# Patient Record
Sex: Female | Born: 1935 | Race: White | Hispanic: No | Marital: Married | Smoking: Never smoker
Health system: Southern US, Community
[De-identification: ages and names within clinical notes are randomized; demographics above are authoritative.]

## PROBLEM LIST (undated history)

## (undated) DIAGNOSIS — M5126 Other intervertebral disc displacement, lumbar region: Secondary | ICD-10-CM

## (undated) DIAGNOSIS — M549 Dorsalgia, unspecified: Secondary | ICD-10-CM

## (undated) DIAGNOSIS — E7800 Pure hypercholesterolemia, unspecified: Secondary | ICD-10-CM

## (undated) DIAGNOSIS — G8929 Other chronic pain: Secondary | ICD-10-CM

## (undated) DIAGNOSIS — F32A Depression, unspecified: Secondary | ICD-10-CM

## (undated) DIAGNOSIS — I10 Essential (primary) hypertension: Secondary | ICD-10-CM

## (undated) DIAGNOSIS — E039 Hypothyroidism, unspecified: Secondary | ICD-10-CM

## (undated) DIAGNOSIS — F419 Anxiety disorder, unspecified: Secondary | ICD-10-CM

## (undated) DIAGNOSIS — G259 Extrapyramidal and movement disorder, unspecified: Secondary | ICD-10-CM

## (undated) DIAGNOSIS — F329 Major depressive disorder, single episode, unspecified: Secondary | ICD-10-CM

## (undated) DIAGNOSIS — K219 Gastro-esophageal reflux disease without esophagitis: Secondary | ICD-10-CM

## (undated) DIAGNOSIS — M1990 Unspecified osteoarthritis, unspecified site: Secondary | ICD-10-CM

## (undated) HISTORY — DX: Extrapyramidal and movement disorder, unspecified: G25.9

## (undated) HISTORY — PX: TONSILLECTOMY: SUR1361

## (undated) HISTORY — PX: OTHER SURGICAL HISTORY: SHX169

## (undated) HISTORY — PX: CHOLECYSTECTOMY: SHX55

## (undated) HISTORY — PX: TOTAL KNEE ARTHROPLASTY: SHX125

---

## 1999-03-24 ENCOUNTER — Other Ambulatory Visit: Admission: RE | Admit: 1999-03-24 | Discharge: 1999-03-24 | Payer: Self-pay | Admitting: Gynecology

## 2000-05-26 ENCOUNTER — Other Ambulatory Visit: Admission: RE | Admit: 2000-05-26 | Discharge: 2000-05-26 | Payer: Self-pay | Admitting: Gynecology

## 2001-06-07 ENCOUNTER — Other Ambulatory Visit: Admission: RE | Admit: 2001-06-07 | Discharge: 2001-06-07 | Payer: Self-pay | Admitting: Gynecology

## 2004-02-20 ENCOUNTER — Encounter: Admission: RE | Admit: 2004-02-20 | Discharge: 2004-02-20 | Payer: Self-pay | Admitting: Internal Medicine

## 2007-11-17 ENCOUNTER — Inpatient Hospital Stay (HOSPITAL_COMMUNITY): Admission: RE | Admit: 2007-11-17 | Discharge: 2007-11-21 | Payer: Self-pay | Admitting: Orthopedic Surgery

## 2008-11-25 IMAGING — CT CT ABD-PELV W/O CM
2 of 4 series · 14 of 42 positions shown, 19 images · non-contrast
Comparison: NONE

CLINICAL DATA: Gas, bloating, and diarrhea. 

CT OF THE ABDOMEN AND PELVIS FOLLOWING ORAL CONTRAST
TECHNIQUE: Axial scans were obtained from the hemidiaphragms 
through the symphysis pubis after oral ingestion of contrast 
material.  Intravenous access was unable to be obtained.

[Series 2: wo · axial · 0.64mm/px · z∈[+389,+724]mm · 11 of 77 slices shown, 16 images]
[im 5/77  soft-tissue]
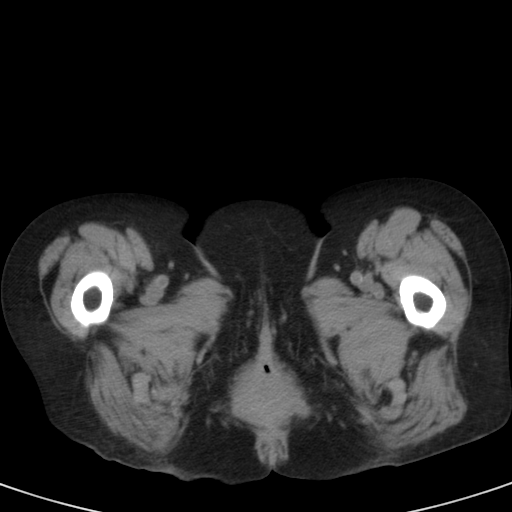
[im 5/77  bone]
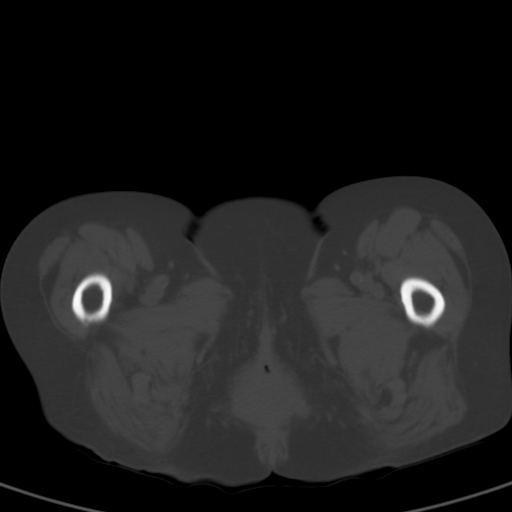
[im 14/77  soft-tissue]
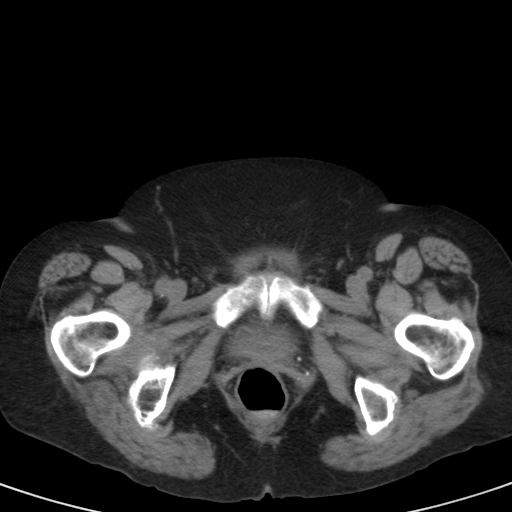
[im 23/77  soft-tissue]
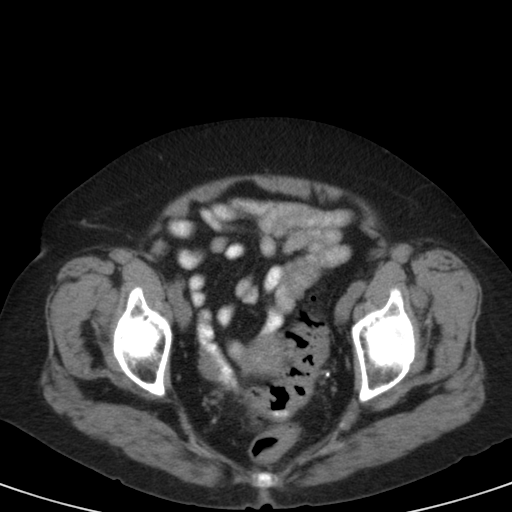
[im 27/77  soft-tissue]
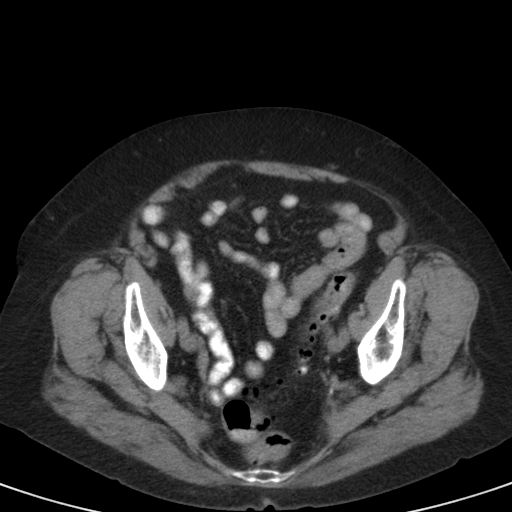
[im 36/77  soft-tissue]
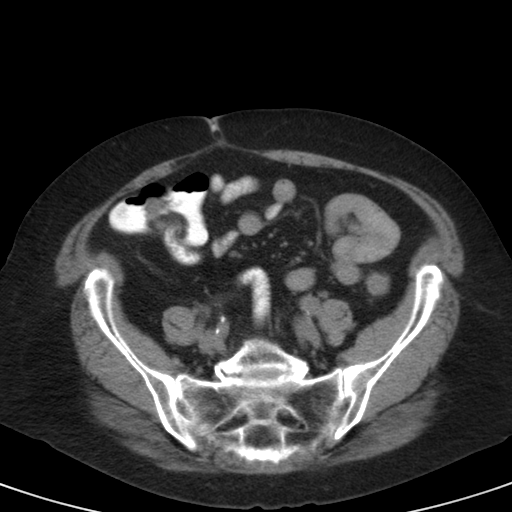
[im 41/77  soft-tissue]
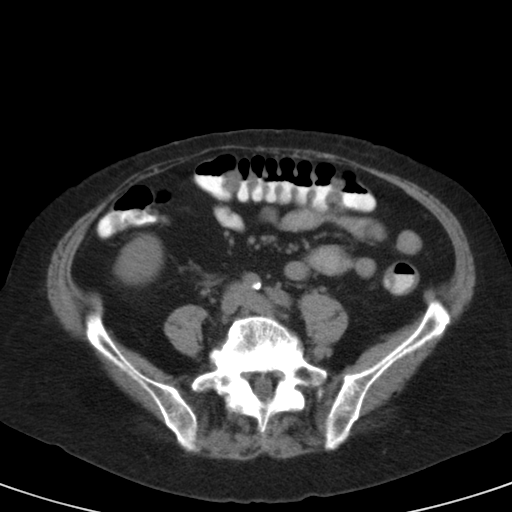
[im 50/77  soft-tissue]
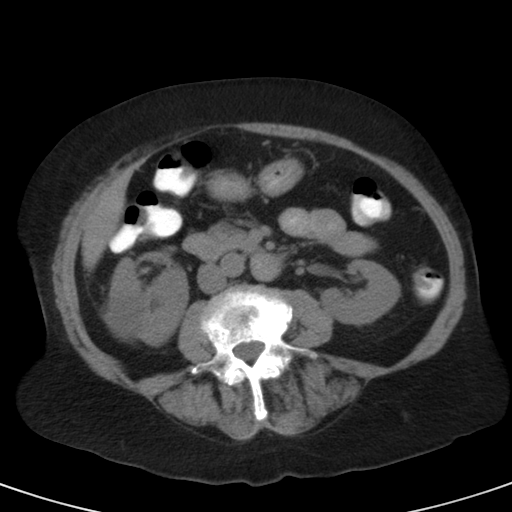
[im 59/77  soft-tissue]
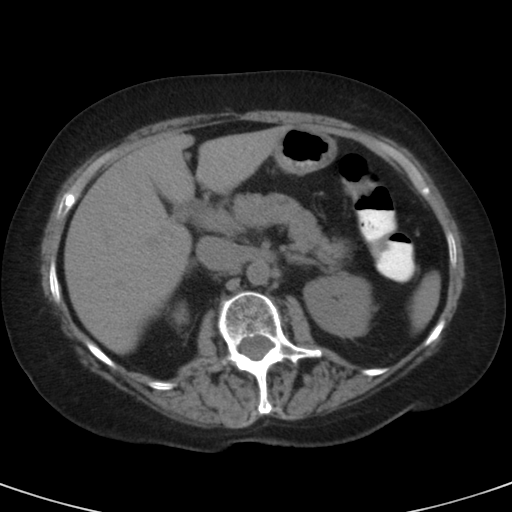
[im 59/77  lung]
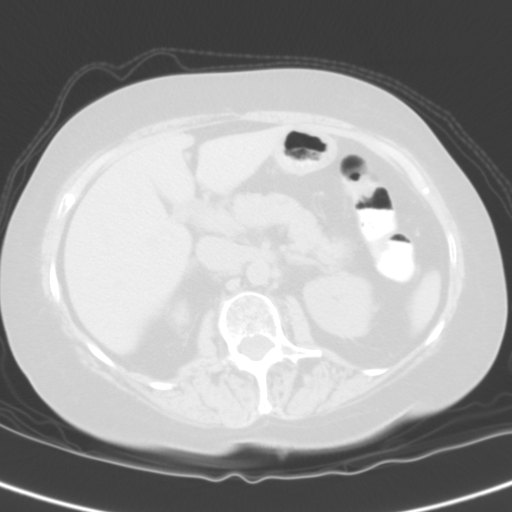
[im 63/77  soft-tissue]
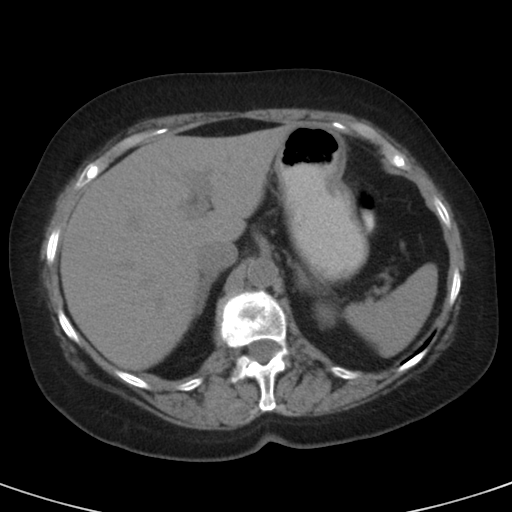
[im 63/77  lung]
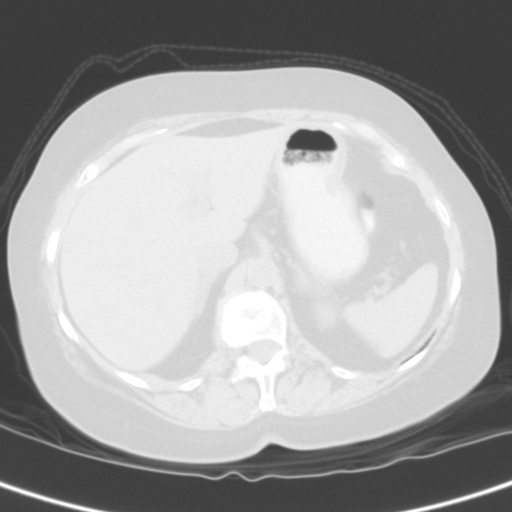
[im 63/77  bone]
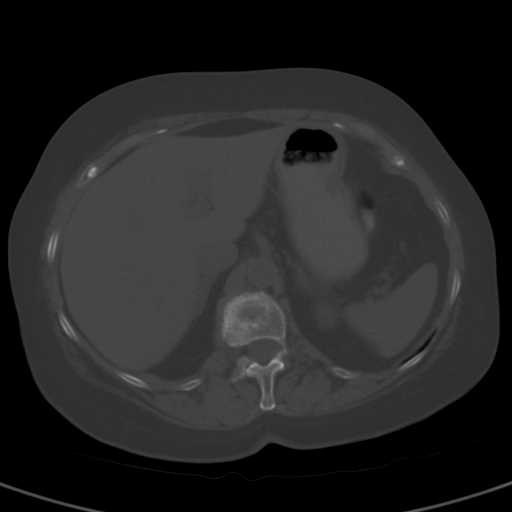
[im 68/77  lung]
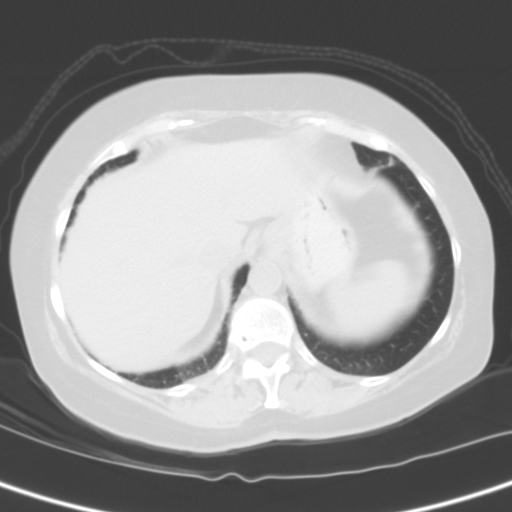
[im 72/77  soft-tissue]
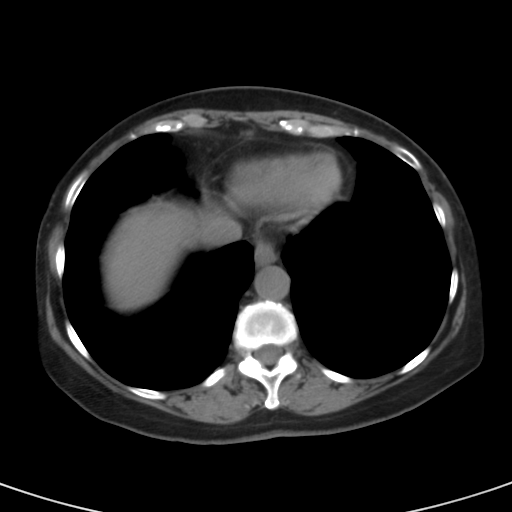
[im 72/77  lung]
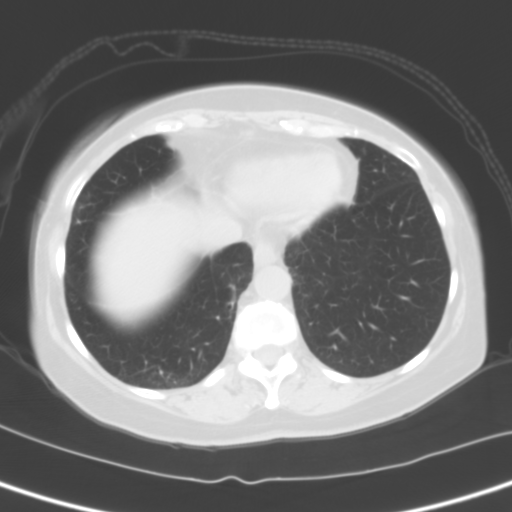

[coronals · coronal · 0.74mm/px · 3 of 63 slices shown]
[im 21/63  soft-tissue]
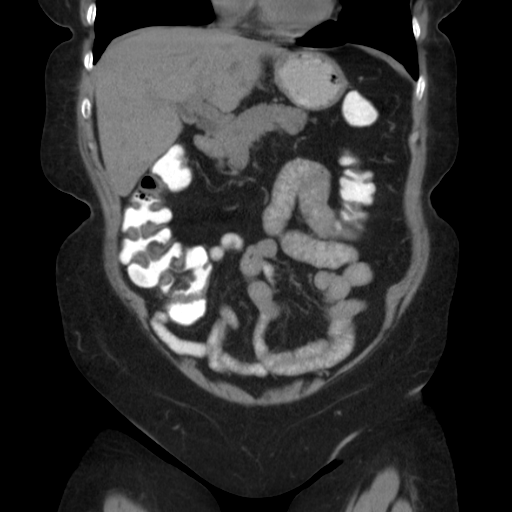
[im 28/63  soft-tissue]
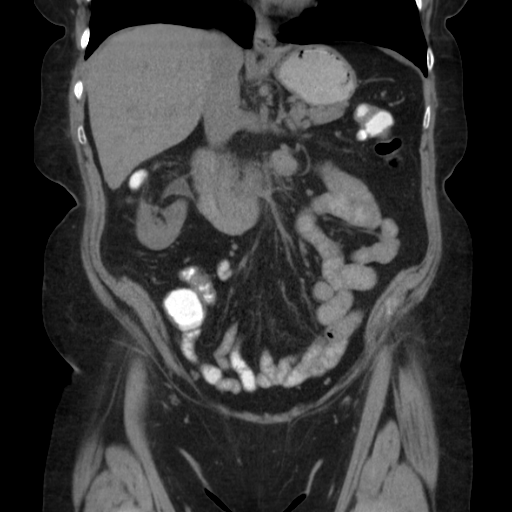
[im 35/63  soft-tissue]
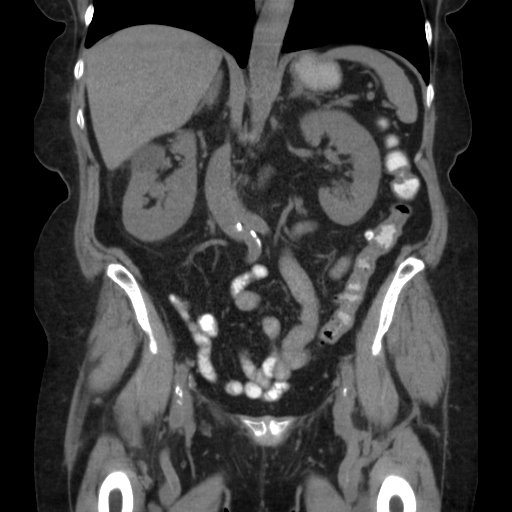

[14 of 42 positions shown; findings below may reference images not displayed]

FINDINGS: The lung bases are clear.  The liver, spleen, pancreas, 
adrenal glands, and left kidney have a normal appearance.  There 
is a 5.5 cm low-density mass in the right kidney most consistent 
with a cyst.  Atherosclerotic calcification is present.  
Spondylosis is noted in the lumbar spine.  No lytic or blastic 
lesions are identified. There is uncomplicated diverticulosis.  I 
see no evidence for diverticulitis.  No inflammatory disease is 
seen in either lower quadrant.  No free fluid.
IMPRESSION: Simple cyst right kidney.  Uncomplicated 
diverticulosis.  No inflammatory disease seen. Eilla Som

## 2010-09-08 NOTE — Op Note (Signed)
NAME:  Kelly Erickson, Kelly Erickson                 ACCOUNT NO.:  [id number]   MEDICAL RECORD NO.:  [id number]          PATIENT TYPE:  INP   LOCATION:  5022                         FACILITY:  [facility]   PHYSICIAN:  Harvie Junior, M.D.   DATE OF BIRTH:  1931/03/03   DATE OF PROCEDURE:  11/17/2007  DATE OF DISCHARGE:                               OPERATIVE REPORT   PREOPERATIVE DIAGNOSIS:  End-stage degenerative joint disease, right.   POSTOPERATIVE DIAGNOSIS:  End-stage degenerative joint disease, right.   PROCEDURE:  1. Right total knee replacement with a  Sigma system size 2.5 femur,      size 2 tibia, 10-mm bridging bearing, and a 32-mm All-Polyethylene      patella.  2. Computer-assisted right total knee replacement.   SURGEON:  Harvie Junior, MD   ASSISTANT:  [name], PA.   ANESTHESIA:  General.   BRIEF HISTORY:  Ms. Kelly Erickson is a 77 year old female with long history of  having had significant degenerative joint disease of the right knee.  She has had previous arthroscopic examination, which showed grade IV  changes over a significant portion of the medial femoral condyle and  medial tibial plateau.  We talked about treatment options and also we  felt that the total knee replacement is going to be the most appropriate  course of action.  She was brought to the operating room for this  procedure.  Because of the longevity in her family and relative youthful  appearance, it was felt that she potentially would need a significant  longevity __________ a  computer-assisted alignment was chosen to be  used.   OPERATIVE PROCEDURE:  The patient was brought to the operating room and  after adequate anesthesia was obtained with general anesthetic, the  patient was placed supine on the operating table.  The right leg was  then prepped and draped in usual sterile fashion.  Following this, a  midline incision was made.  Subcutaneous tissues was taken down to level  of the extensor mechanism,  medial parapatellar arthrotomy was  undertaken, and once this was completed, the medial and lateral  meniscectomy was performed as well as anterior and posterior cruciate  excision, and eventual patellar fat-pad excision.  Once this was  completed, retractors were put in place and then the computer-assisted  modules were attached, 2 pins in the tibia, 2 pins in the femur, and  then the arrays.  Once this was completed and marked down, then we did a  registration process.  This lasted for about 20 minutes of the surgical  procedure.  Once this was completed, the tibia has been cut  perpendicular to the long axis under computer-assisted guidance.  Once  this was completed, the femur is cut perpendicular to the anatomic axis.  Once that was completed, spacer bar was put in place to check the  overall gaps that showed to have certainly adequate gaps at this point.  Attention then turned to trialing the femur, which was sized with my  computer and by a regular guide.  The femur size was 2.5 and this  was  chosen, anterior and posterior cuts were made as well as chamfers.  Then, attention was turned to the tibia, which was sized to a 2.  We  then reamed distally for this and then put a size 2 trial in with a  hammer in the keel at this point.  Trial reduction was then undertaken  as part of the 10-mm bridging bearing easily with full extension.  No  medial or lateral instability, excellent stability, excellent gap  balance, and perfect neutral alignment.  Once this was completed,  attention was turned towards the 20 mm of patella length and cut down to  the level of 13, just to allow access for the component, and 32 really  was generous.  We were able to get a cummerbund down completely, but it  was a generous situation.  At this point, the 32 was chosen, the lugs  were drilled for it and the trialed patella was put in place.  The femur  then had lugs drilled.  Once this was completed, all trial  components  were removed.  The knee was copiously and thoroughly irrigated with  pulsatile lavage irrigation and then dried.  At this point, cement was  mixed on the back table, 2 bags of cement with 2 g of Kefzol and this  was mixed; and once this was completed, the components were cemented in  place with size 2 tibia and size 2.5 femur and a trial 10-mm bearing was  put in place and a 32-mm All-Polyethylene patella.  Once the cement was  allowed to harden, the tourniquet was let down, the trial poly was  removed, and all excess cement had been removed previously, and a final  check was made for loosened cement, none was found.  At this point, the  bleeding was controlled with electrocautery and attention was turned  towards the placement of the __________, which was then placed and once  it was placed, range of motion was then again checked, to ease the full  extension, flexion was followed, and at this point, the medial  parapatellar arthrotomy was closed with 1-Vicryl running after a medium  Hemovac drain was left in place.  The skin was closed with 0 and 2-0  Vicryl, and skin with staples.  A sterile compression dressing was  applied as well as a knee immobilizer and the patient was taken to the  recovery room and she was noted to be in satisfactory condition.   ESTIMATED BLOOD LOSS:  Less than 100 mL.      Harvie Junior, M.D.  Electronically Signed     JLG/MEDQ  D:  11/17/2007  T:  11/18/2007  Job:  [id number]

## 2010-09-11 NOTE — Discharge Summary (Signed)
NAME:  Kelly Erickson, Kelly Erickson                 ACCOUNT NO.:  [id number]   MEDICAL RECORD NO.:  [id number]          PATIENT TYPE:  INP   LOCATION:  6729                         FACILITY:  [facility]   PHYSICIAN:  Harvie Junior, M.D.   DATE OF BIRTH:  1931-03-09   DATE OF ADMISSION:  11/17/2007  DATE OF DISCHARGE:  11/21/2007                               DISCHARGE SUMMARY   ADMITTING DIAGNOSES:  1. End-stage degenerative joint disease, right knee.  2. Hypertension.  3. Hyperlipidemia.   DISCHARGE DIAGNOSES:  1. End-stage degenerative joint disease, right knee.  2. Hypertension.  3. Hyperlipidemia.  4. Urinary tract infection.  5. Acute blood loss anemia.   PROCEDURES IN HOSPITAL:  Right total knee arthroplasty, computer-  assisted, Jodi Geralds MD.   CONSULTATIONS:  None.   BRIEF HISTORY:  Kelly Erickson is a pleasant 77 year old patient, is well known  to [place].  She has a long history of right knee pain.  She has swelling and  grinding when she bends her knee.  Standing x-rays of the right knee  shows that she has bone-on-bone degenerative arthritis with complete  loss of articular cartilage.  She has night pain and pain with every  step and has gotten no relief with exhaustive conservative treatment  including modification of activity, use of medications, and cortisone  injections.  Based upon her clinical radiographic findings, she is felt  to be a candidate for a right total knee arthroplasties and is admitted  for this.   PERTINENT LABORATORY STUDIES:  The patient's EKG on admission showed  normal sinus rhythm with nonspecific T-wave abnormality.  Hemoglobin on  admission was 12.6, hematocrit 37.6, indices within normal limits.  On  postop day #1, hemoglobin was 9.0; postop day #2, it was 8.7; postop day  #3, it was 8.8.  Protime on admission was 12.5 seconds with INR of 0.9,  PTT was 26 on day of discharge.  Her INR was 1.7.  BMET on postop day #1  showed no abnormalities other than slightly  elevated BUN at 33.  On  postop day #2, her BUN and creatinine were in the normal range.  Urinalysis on admission showed 7-10 WBCs per high-powered field.  Urine  culture on November 20, 2007, showed greater than 1000 colonies of  enterococcus species.   HOSPITAL COURSE:  The patient underwent right total knee arthroplasty,  computer-assisted as well as described in the Dr. Luiz Blare' operative note  on November 17, 2007.  Preoperatively, she was given 80 mg IV of gentamicin  and 1 g of Ancef.  She underwent a right total knee replacement as well  as described in the Dr. Luiz Blare' operative note.  She was put on the PCA  and morphine pump for pain control.  Physical therapy is ordered for  ambulation weightbearing as tolerated on the right side.  CPM machine  was used for range of motion.  On postop day #1, her pain was well-  controlled.  No shortness of breath.  Her hemoglobin was 9.0.  She was  afebrile.  Her vital signs were stable.  Her  right knee dressing was  clean and dry and knee was intact distally.  She had a little confusion  and the Percocet was discontinued and she was started on Vicodin for  pain and minimize the use of the morphine.  On postop day #2, she was  comfortable and she is alert and oriented.  She is using Tylenol for  pain at this point.  Her hemoglobin is 8.7.  She is continued on oral  iron, which she had started at the time of surgery.  Her dressing was  changed on her Foley catheter was removed and her drain was  discontinued.  Her IV was converted to a saline lock.  On postop day #3,  she had some complaints of frequent urination.  Her temperature was  99.2.  A urinalysis was obtained and she was started on antibiotics for  urinary tract infection and urine cultures obtained.  She made good  progress with her therapy and on November 21, 2007, she was in a improved  condition.  Her INR was 1.7.  Hemoglobin was stable.  She was discharged  home after being seen by physical  therapy.  She is at home CPM machine  for right knee.  She will be on weightbearing as tolerated on right.  She will be on a regular diet.  She will continue on her usual home  medications with the addition of Cipro 500 mg b.i.d. x5 days for urinary  tract infection, Percocet 5 mg one to two q.4-6 h. p.r.n. pain, and  Coumadin 5 mg one daily unless directed to take otherwise.  She will get  home health physical therapy and home health nurse for protimes and  Coumadin management x1 month postop for DVT prophylaxis.  She will  follow up Dr. Stacy Gardner office in 2 weeks.  She will keep her wound clean  and dry.  Change her dressing every third day.  Call if any problems.      [name], P.A.      Harvie Junior, M.D.  Electronically Signed    JB/MEDQ  D:  01/03/2008  T:  01/04/2008  Job:  [id number]   cc:   Kendrick [place], M.D.

## 2011-01-22 LAB — URINE CULTURE
Colony Count: >=100000
Special Requests: NEGATIVE

## 2011-01-22 LAB — URINALYSIS, ROUTINE W REFLEX MICROSCOPIC
Bilirubin Urine: NEGATIVE
Bilirubin Urine: NEGATIVE
Glucose, UA: NEGATIVE
Glucose, UA: NEGATIVE
Hgb urine dipstick: NEGATIVE
Hgb urine dipstick: NEGATIVE
Ketones, ur: NEGATIVE
Ketones, ur: NEGATIVE
Nitrite: NEGATIVE
Nitrite: NEGATIVE
Protein, ur: NEGATIVE
Protein, ur: NEGATIVE
Specific Gravity, Urine: 1.007
Specific Gravity, Urine: 1.026
Urobilinogen, UA: 0.2
Urobilinogen, UA: 0.2
pH: 6
pH: 6.5

## 2011-01-22 LAB — CBC
HCT: 25.2 — ABNORMAL LOW
HCT: 25.9 — ABNORMAL LOW
HCT: 26.7 — ABNORMAL LOW
HCT: 37.6
Hemoglobin: 12.6
Hemoglobin: 8.7 — ABNORMAL LOW
Hemoglobin: 8.8 — ABNORMAL LOW
Hemoglobin: 9 — ABNORMAL LOW
MCHC: 33.4
MCHC: 33.8
MCHC: 33.9
MCHC: 34.4
MCV: 88.4
MCV: 89.6
MCV: 90.2
MCV: 90.4
Platelets: 304
Platelets: 312
Platelets: 337
Platelets: 439 — ABNORMAL HIGH
RBC: 2.85 — ABNORMAL LOW
RBC: 2.89 — ABNORMAL LOW
RBC: 2.96 — ABNORMAL LOW
RBC: 4.16
RDW: 11.9
RDW: 12.1
RDW: 12.1
RDW: 12.1
WBC: 10.2
WBC: 10.4
WBC: 11.9 — ABNORMAL HIGH
WBC: 12.1 — ABNORMAL HIGH

## 2011-01-22 LAB — DIFFERENTIAL
Basophils Absolute: 0.1
Basophils Relative: 1
Eosinophils Absolute: 0.2
Eosinophils Relative: 2
Lymphocytes Relative: 19
Lymphs Abs: 2
Monocytes Absolute: 0.9
Monocytes Relative: 9
Neutro Abs: 7
Neutrophils Relative %: 69

## 2011-01-22 LAB — COMPREHENSIVE METABOLIC PANEL
ALT: 25
AST: 29
Albumin: 4
Alkaline Phosphatase: 90
BUN: 33 — ABNORMAL HIGH
CO2: 28
Calcium: 10.8 — ABNORMAL HIGH
Chloride: 104
Creatinine, Ser: 1.08
GFR calc Af Amer: 60 — ABNORMAL LOW
GFR calc non Af Amer: 49 — ABNORMAL LOW
Glucose, Bld: 105 — ABNORMAL HIGH
Potassium: 4.4
Sodium: 142
Total Bilirubin: 0.8
Total Protein: 6.9

## 2011-01-22 LAB — PROTIME-INR
INR: 0.9
INR: 0.9
INR: 1.1
INR: 1.3
INR: 1.7 — ABNORMAL HIGH
Prothrombin Time: 12.5
Prothrombin Time: 12.5
Prothrombin Time: 14.3
Prothrombin Time: 16.4 — ABNORMAL HIGH
Prothrombin Time: 20.3 — ABNORMAL HIGH

## 2011-01-22 LAB — BASIC METABOLIC PANEL
BUN: 16
CO2: 25
Calcium: 8.7
Chloride: 95 — ABNORMAL LOW
Creatinine, Ser: 0.9
GFR calc Af Amer: >60
GFR calc non Af Amer: >60
Glucose, Bld: 154 — ABNORMAL HIGH
Potassium: 3.9
Sodium: 131 — ABNORMAL LOW

## 2011-01-22 LAB — URINE MICROSCOPIC-ADD ON

## 2011-01-22 LAB — TYPE AND SCREEN
ABO/RH(D): O POS
Antibody Screen: NEGATIVE

## 2011-01-22 LAB — APTT: aPTT: 26

## 2011-01-22 LAB — ABO/RH: ABO/RH(D): O POS

## 2011-07-11 ENCOUNTER — Emergency Department (HOSPITAL_BASED_OUTPATIENT_CLINIC_OR_DEPARTMENT_OTHER)
Admission: EM | Admit: 2011-07-11 | Discharge: 2011-07-11 | Disposition: A | Discharge status: Home / Self Care | Payer: Medicare Other | Attending: Emergency Medicine | Admitting: Emergency Medicine

## 2011-07-11 ENCOUNTER — Encounter (HOSPITAL_BASED_OUTPATIENT_CLINIC_OR_DEPARTMENT_OTHER): Payer: Self-pay | Admitting: Emergency Medicine

## 2011-07-11 DIAGNOSIS — F411 Generalized anxiety disorder: Secondary | ICD-10-CM | POA: Insufficient documentation

## 2011-07-11 DIAGNOSIS — M129 Arthropathy, unspecified: Secondary | ICD-10-CM | POA: Insufficient documentation

## 2011-07-11 DIAGNOSIS — M545 Low back pain, unspecified: Secondary | ICD-10-CM | POA: Insufficient documentation

## 2011-07-11 DIAGNOSIS — F329 Major depressive disorder, single episode, unspecified: Secondary | ICD-10-CM | POA: Insufficient documentation

## 2011-07-11 DIAGNOSIS — K219 Gastro-esophageal reflux disease without esophagitis: Secondary | ICD-10-CM | POA: Insufficient documentation

## 2011-07-11 DIAGNOSIS — E039 Hypothyroidism, unspecified: Secondary | ICD-10-CM | POA: Insufficient documentation

## 2011-07-11 DIAGNOSIS — I10 Essential (primary) hypertension: Secondary | ICD-10-CM | POA: Insufficient documentation

## 2011-07-11 DIAGNOSIS — F3289 Other specified depressive episodes: Secondary | ICD-10-CM | POA: Insufficient documentation

## 2011-07-11 DIAGNOSIS — E7800 Pure hypercholesterolemia, unspecified: Secondary | ICD-10-CM | POA: Insufficient documentation

## 2011-07-11 DIAGNOSIS — M5126 Other intervertebral disc displacement, lumbar region: Secondary | ICD-10-CM | POA: Insufficient documentation

## 2011-07-11 HISTORY — DX: Hypothyroidism, unspecified: E03.9

## 2011-07-11 HISTORY — DX: Other intervertebral disc displacement, lumbar region: M51.26

## 2011-07-11 HISTORY — DX: Pure hypercholesterolemia, unspecified: E78.00

## 2011-07-11 HISTORY — DX: Gastro-esophageal reflux disease without esophagitis: K21.9

## 2011-07-11 HISTORY — DX: Other chronic pain: G89.29

## 2011-07-11 HISTORY — DX: Major depressive disorder, single episode, unspecified: F32.9

## 2011-07-11 HISTORY — DX: Anxiety disorder, unspecified: F41.9

## 2011-07-11 HISTORY — DX: Depression, unspecified: F32.A

## 2011-07-11 HISTORY — DX: Dorsalgia, unspecified: M54.9

## 2011-07-11 HISTORY — DX: Unspecified osteoarthritis, unspecified site: M19.90

## 2011-07-11 HISTORY — DX: Essential (primary) hypertension: I10

## 2011-07-11 MED ORDER — CYCLOBENZAPRINE HCL 10 MG PO TABS: 10.0000 mg | ORAL_TABLET | Freq: Three times a day (TID) | ORAL | Status: AC | PRN

## 2011-07-11 MED ORDER — PREDNISONE 20 MG PO TABS: 40.0000 mg | ORAL_TABLET | Freq: Every day | ORAL | Status: AC

## 2011-07-11 NOTE — Discharge Instructions (Signed)
Back Pain, Adult Low back pain is very common. About 1 in 5 people have back pain.The cause of low back pain is rarely dangerous. The pain often gets better over time.About half of people with a sudden onset of back pain feel better in just 2 weeks. About 8 in 10 people feel better by 6 weeks.  CAUSES Some common causes of back pain include:  Strain of the muscles or ligaments supporting the spine.   Wear and tear (degeneration) of the spinal discs.   Arthritis.   Direct injury to the back.  DIAGNOSIS Most of the time, the direct cause of low back pain is not known.However, back pain can be treated effectively even when the exact cause of the pain is unknown.Answering your caregiver's questions about your overall health and symptoms is one of the most accurate ways to make sure the cause of your pain is not dangerous. If your caregiver needs more information, he or she may order lab work or imaging tests (X-rays or MRIs).However, even if imaging tests show changes in your back, this usually does not require surgery. HOME CARE INSTRUCTIONS For many people, back pain returns.Since low back pain is rarely dangerous, it is often a condition that people can learn to manageon their own.   Remain active. It is stressful on the back to sit or stand in one place. Do not sit, drive, or stand in one place for more than 30 minutes at a time. Take short walks on level surfaces as soon as pain allows.Try to increase the length of time you walk each day.   Do not stay in bed.Resting more than 1 or 2 days can delay your recovery.   Do not avoid exercise or work.Your body is made to move.It is not dangerous to be active, even though your back may hurt.Your back will likely heal faster if you return to being active before your pain is gone.   Pay attention to your body when you bend and lift. Many people have less discomfortwhen lifting if they bend their knees, keep the load close to their  bodies,and avoid twisting. Often, the most comfortable positions are those that put less stress on your recovering back.   Find a comfortable position to sleep. Use a firm mattress and lie on your side with your knees slightly bent. If you lie on your back, put a pillow under your knees.   Only take over-the-counter or prescription medicines as directed by your caregiver. Over-the-counter medicines to reduce pain and inflammation are often the most helpful.Your caregiver may prescribe muscle relaxant drugs.These medicines help dull your pain so you can more quickly return to your normal activities and healthy exercise.   Put ice on the injured area.   Put ice in a plastic bag.   Place a towel between your skin and the bag.   Leave the ice on for 15 to 20 minutes, 3 to 4 times a day for the first 2 to 3 days. After that, ice and heat may be alternated to reduce pain and spasms.   Ask your caregiver about trying back exercises and gentle massage. This may be of some benefit.   Avoid feeling anxious or stressed.Stress increases muscle tension and can worsen back pain.It is important to recognize when you are anxious or stressed and learn ways to manage it.Exercise is a great option.  SEEK MEDICAL CARE IF:  You have pain that is not relieved with rest or medicine.   You have   pain that does not improve in 1 week.   You have new symptoms.   You are generally not feeling well.  SEEK IMMEDIATE MEDICAL CARE IF:   You have pain that radiates from your back into your legs.   You develop new bowel or bladder control problems.   You have unusual weakness or numbness in your arms or legs.   You develop nausea or vomiting.   You develop abdominal pain.   You feel faint.  Document Released: 04/12/2005 Document Revised: 04/01/2011 Document Reviewed: 08/31/2010 Exit[employer] Patient Information 2012 [employer].     RESOURCE GUIDE  Dental Problems  Patients with  Medicaid: [facility]                     [facility] [address]                                           [address] Phone:  [phone]                                                  Phone:  [phone]  If unable to pay or uninsured, contact:  [facility] or [facility] to become qualified for the adult dental clinic.  Chronic Pain Problems Contact [facility]  [phone] Patients need to be referred by their primary care doctor.  Insufficient Money for Medicine Contact [employer]:  call "211" or [facility] 271-5999.  No Primary Care Doctor Call [facility]  832-8000 Other agencies that provide inexpensive medical care    [facility]  832-8035    [facility]  832-7272    [facility]  [phone]    [facility] Clinic  832-4777    [facility]  [phone]    [facility]  [phone]  Psychological Services [facility]  [phone] [facility]  [phone] [facility]   800 853-5163 (emergency services 641-4993)  Substance Abuse Resources [facility]  336-882-2125 [facility] 336-784-9470 [facility] 336-285-9073 [facility] 336-845-3988 Residential & Outpatient Substance Abuse Program  800-659-3381  Abuse/Neglect [place] Child Abuse Hotline (336) 641-3795 [place] Child Abuse Hotline 800-378-5315 (After Hours)  Emergency Shelter [facility] (336) 271-5985  [facility] (336) 275-9566 [facility] (704) 372-4663  MRSA Hotline #:   832-7006    [place] Resources  [facility] [place]     [employer]                          [facility] [address] [place]                       [address]      [address]  [place]                                                   [place]                            [place] Phone:  [phone]                                   Phone:  [phone]                 Phone:  [phone]  [facility] Phone:  [phone]  [place] Child Abuse Hotline (336) 342-1394 (336) 342-3537 (After Hours)   

## 2011-07-11 NOTE — ED Provider Notes (Signed)
History    Kelly Erickson with lower back pain. Gradual onset 2 weeks ago. Radiates to R leg. Denies trauma. Pain achy at rest and much worse with movement. No fever or chills. No numbness, tingling or loss of strength. No urinary/bowel incontinence or retention. denis hx of spinal surgery. Denies use of blood thinning medication. No urinary complaints.  CSN: [id number]  Arrival date & time 07/11/11  1036   First MD Initiated Contact with Patient 07/11/11 1054      Chief Complaint  Patient presents with  . Leg Pain  . Back Pain    (Consider location/radiation/quality/duration/timing/severity/associated sxs/prior treatment) HPI  Past Medical History  Diagnosis Date  . Chronic back pain   . Hypertension   . GERD (gastroesophageal reflux disease)   . Hypercholesteremia   . Arthritis   . Lumbar herniated disc   . Hypothyroid   . Depression   . Anxiety     No past surgical history on file.  No family history on file.  History  Substance Use Topics  . Smoking status: Not on file  . Smokeless tobacco: Not on file  . Alcohol Use:     OB History    Grav Para Term Preterm Abortions TAB SAB Ect Mult Living                  Review of Systems   Review of symptoms negative unless otherwise noted in HPI.   Allergies  Review of patient's allergies indicates not on file.  Home Medications   Current Outpatient Rx  Name Route Sig Dispense Refill  . AMLODIPINE BESYLATE 10 MG PO TABS Oral Take 10 mg by mouth daily.    Marland Kitchen HYDROCODONE-ACETAMINOPHEN 5-325 MG PO TABS Oral Take 1 tablet by mouth 2 (two) times daily as needed.    Marland Kitchen LEVOTHYROXINE SODIUM 25 MCG PO TABS Oral Take 25 mcg by mouth daily.    Marland Kitchen METHOCARBAMOL 500 MG PO TABS Oral Take 500 mg by mouth 3 (three) times daily as needed.    Marland Kitchen METOPROLOL SUCCINATE ER 50 MG PO TB24 Oral Take 50 mg by mouth daily. Take with or immediately following a meal.      BP 134/72  Pulse 99  Temp(Src) 98.4 F (36.9 C) (Oral)  Resp 16  Ht 4'  10" (1.473 m)  Wt 115 lb (52.164 kg)  BMI 24.04 kg/m2  SpO2 97%  Physical Exam  Nursing note and vitals reviewed. Constitutional: She appears well-developed and well-nourished. No distress.  HENT:  Head: Normocephalic and atraumatic.  Eyes: Conjunctivae are normal. Right eye exhibits no discharge. Left eye exhibits no discharge.  Neck: Neck supple.  Cardiovascular: Normal rate, regular rhythm and normal heart sounds.  Exam reveals no gallop and no friction rub.   No murmur heard. Pulmonary/Chest: Effort normal and breath sounds normal. No respiratory distress.  Abdominal: Soft. She exhibits no distension. There is no tenderness.  Musculoskeletal: She exhibits no edema and no tenderness.       No midline spinal tenderness. No concerning overlying skin changes. Back pain not reproducible with palpation but is with ROM of R hip.  Neurological: She is alert. She exhibits normal muscle tone. Coordination normal.  Skin: Skin is warm and dry.  Psychiatric: She has a normal mood and affect. Her behavior is normal. Thought content normal.    ED Course  Procedures (including critical care time)  Labs Reviewed - No data to display No results found.   1. Low back pain  MDM  Kelly Erickson with back pain. No hx of acute trauma. nonfocal neuro exam. Afebrile and well appearing. Suspect MS. No evidence of cord compression. Doubt seh/sea, no trauma to suggest fx, no siginificant risk factors for osteo. Doubt spetic joint. Plan symptomatic tx and outpt fu. Return precautions discussed.        Raeford Razor, MD 07/30/11 [phone]

## 2011-07-11 NOTE — ED Notes (Signed)
Pt states she had shooting pain go up her leg 2-3 weeks ago.  Pt went to physician, and was given medication but for the last week the pain continues with no changes after medication.  Pt also states she has chronic back pain which has been continuing to bother her.  No dysuria or numbness/tingling at this time.

## 2011-07-14 ENCOUNTER — Ambulatory Visit
Admission: RE | Admit: 2011-07-14 | Discharge: 2011-07-14 | Disposition: A | Discharge status: Home / Self Care | Payer: Medicare Other | Source: Ambulatory Visit | Attending: Physical Medicine and Rehabilitation | Admitting: Physical Medicine and Rehabilitation

## 2011-07-14 ENCOUNTER — Other Ambulatory Visit: Payer: Self-pay | Admitting: Physical Medicine and Rehabilitation

## 2011-07-14 DIAGNOSIS — M545 Low back pain: Secondary | ICD-10-CM

## 2011-07-15 ENCOUNTER — Emergency Department (HOSPITAL_BASED_OUTPATIENT_CLINIC_OR_DEPARTMENT_OTHER)
Admission: EM | Admit: 2011-07-15 | Discharge: 2011-07-15 | Disposition: A | Discharge status: Home Health | Payer: Medicare Other | Attending: Emergency Medicine | Admitting: Emergency Medicine

## 2011-07-15 ENCOUNTER — Encounter (HOSPITAL_BASED_OUTPATIENT_CLINIC_OR_DEPARTMENT_OTHER): Payer: Self-pay | Admitting: *Deleted

## 2011-07-15 DIAGNOSIS — E7800 Pure hypercholesterolemia, unspecified: Secondary | ICD-10-CM | POA: Insufficient documentation

## 2011-07-15 DIAGNOSIS — K219 Gastro-esophageal reflux disease without esophagitis: Secondary | ICD-10-CM | POA: Insufficient documentation

## 2011-07-15 DIAGNOSIS — E039 Hypothyroidism, unspecified: Secondary | ICD-10-CM | POA: Insufficient documentation

## 2011-07-15 DIAGNOSIS — G8929 Other chronic pain: Secondary | ICD-10-CM | POA: Insufficient documentation

## 2011-07-15 DIAGNOSIS — IMO0002 Reserved for concepts with insufficient information to code with codable children: Secondary | ICD-10-CM | POA: Insufficient documentation

## 2011-07-15 DIAGNOSIS — M5410 Radiculopathy, site unspecified: Secondary | ICD-10-CM

## 2011-07-15 DIAGNOSIS — I10 Essential (primary) hypertension: Secondary | ICD-10-CM | POA: Insufficient documentation

## 2011-07-15 DIAGNOSIS — M79609 Pain in unspecified limb: Secondary | ICD-10-CM | POA: Insufficient documentation

## 2011-07-15 DIAGNOSIS — M545 Low back pain, unspecified: Secondary | ICD-10-CM | POA: Insufficient documentation

## 2011-07-15 MED ORDER — ONDANSETRON 4 MG PO TBDP
4.0000 mg | ORAL_TABLET | Freq: Once | ORAL | Status: AC
  Administered 2011-07-15: 4 mg via ORAL
  Filled 2011-07-15: qty 1

## 2011-07-15 MED ORDER — GABAPENTIN 100 MG PO CAPS: 100.0000 mg | ORAL_CAPSULE | Freq: Three times a day (TID) | ORAL | Status: AC

## 2011-07-15 MED ORDER — HYDROCODONE-ACETAMINOPHEN 5-325 MG PO TABS
1.0000 | ORAL_TABLET | Freq: Once | ORAL | Status: AC
  Administered 2011-07-15: 1 via ORAL
  Filled 2011-07-15: qty 1

## 2011-07-15 NOTE — ED Provider Notes (Signed)
History     CSN: [id number]  Arrival date & time 07/15/11  1138   First MD Initiated Contact with Patient 07/15/11 1214      Chief Complaint  Patient presents with  . Leg Pain    left leg    (Consider location/radiation/quality/duration/timing/severity/associated sxs/prior treatment) HPI Patient presents with ongoing left proximal leg pain.  Her pain began several weeks ago when she felt the sudden onset while stretching the: Her socks.  Since that time she's had been persistently about her left thigh circumferentially.  The pain is described as sharp and burning.  The pain is worse with motion or any activity.  Pain is minimally improved with Tylenol.  Patient has also tried Flexeril without improvement.  The patient has a long history of low back pain.  This pain has become worse over this same timeframe.  She denies any new dysuria, fevers, chills, abdominal pain, nausea, vomiting. The patient was seen by her orthopedist yesterday sent for MRI.  Today the patient presents with request for repeat evaluation and wants to know the MRI results. Past Medical History  Diagnosis Date  . Chronic back pain   . Hypertension   . GERD (gastroesophageal reflux disease)   . Hypercholesteremia   . Arthritis   . Lumbar herniated disc   . Hypothyroid   . Depression   . Anxiety     Past Surgical History  Procedure Date  . Total knee arthroplasty     right  . Cholecystectomy   . Tonsillectomy     No family history on file.  History  Substance Use Topics  . Smoking status: Never Smoker   . Smokeless tobacco: Not on file  . Alcohol Use: Yes     occassionally    OB History    Grav Para Term Preterm Abortions TAB SAB Ect Mult Living                  Review of Systems  Constitutional:       HPI  HENT:       HPI otherwise negative  Eyes: Negative.   Respiratory:       HPI, otherwise negative  Cardiovascular:       HPI, otherwise nmegative  Gastrointestinal: Negative for  vomiting.  Genitourinary:       HPI, otherwise negative  Musculoskeletal:       HPI, otherwise negative  Skin: Negative.   Neurological: Negative for syncope.    Allergies  Review of patient's allergies indicates no known allergies.  Home Medications   Current Outpatient Rx  Name Route Sig Dispense Refill  . AMLODIPINE BESYLATE 10 MG PO TABS Oral Take 10 mg by mouth daily.    Marland Kitchen CALCIUM CARBONATE 1250 MG PO TABS Oral Take 1 tablet by mouth daily.    Marland Kitchen CLONAZEPAM 0.5 MG PO TABS Oral Take 0.5 mg by mouth at bedtime as needed.    . CYCLOBENZAPRINE HCL 10 MG PO TABS Oral Take 1 tablet (10 mg total) by mouth 3 (three) times daily as needed for muscle spasms. 12 tablet 0  . FUROSEMIDE 40 MG PO TABS Oral Take 40 mg by mouth daily.    Marland Kitchen HYDROCODONE-ACETAMINOPHEN 5-325 MG PO TABS Oral Take 1 tablet by mouth 2 (two) times daily as needed.    Marland Kitchen LEVOTHYROXINE SODIUM 25 MCG PO TABS Oral Take 25 mcg by mouth daily.    . MELOXICAM 7.5 MG PO TABS Oral Take 7.5 mg by mouth daily.    Marland Kitchen  METHOCARBAMOL 500 MG PO TABS Oral Take 500 mg by mouth 3 (three) times daily as needed.    Marland Kitchen METOPROLOL SUCCINATE ER 50 MG PO TB24 Oral Take 50 mg by mouth daily. Take with or immediately following a meal.    . MULTIVITAMINS PO CAPS Oral Take 1 capsule by mouth daily.    Marland Kitchen OMEPRAZOLE 20 MG PO CPDR Oral Take 20 mg by mouth daily.    Marland Kitchen POTASSIUM CHLORIDE CRYS ER 20 MEQ PO TBCR Oral Take 20 mEq by mouth 2 (two) times daily.    Marland Kitchen PREDNISONE 20 MG PO TABS Oral Take 2 tablets (40 mg total) by mouth daily. 10 tablet 0  . ROSUVASTATIN CALCIUM 10 MG PO TABS Oral Take 10 mg by mouth daily.    . SERTRALINE HCL 25 MG PO TABS Oral Take 25 mg by mouth daily.      BP 145/88  Pulse 106  Temp(Src) 98.2 F (36.8 C) (Oral)  Resp 20  Ht 4\' 10"  (1.473 m)  Wt 115 lb (52.164 kg)  BMI 24.04 kg/m2  SpO2 98%  Physical Exam  Constitutional: She appears well-developed and well-nourished. No distress.  HENT:  Head: Normocephalic and  atraumatic.  Eyes: Conjunctivae are normal.  Cardiovascular: Normal rate and regular rhythm.   Pulmonary/Chest: Effort normal. No stridor.  Abdominal: Soft. There is no tenderness.  Musculoskeletal:       Legs:   ED Course  Procedures (including critical care time)  Labs Reviewed - No data to display Mr Lumbar Spine Wo Contrast  07/14/2011  *RADIOLOGY REPORT*  Clinical Data: Low back pain radiating into both legs for 7 months.  MRI LUMBAR SPINE WITHOUT CONTRAST  Technique:  Multiplanar and multiecho pulse sequences of the lumbar spine were obtained without intravenous contrast.  Comparison: MRI lumbar spine 02/12/2011.  Findings: Again seen is 0.6 cm of anterolisthesis of L3 on L4 due to facet arthropathy.  Inferior endplate compression fracture T12 versus Schmorl's node is again seen.  There is a new defect in the inferior endplate of L1 which could be due to a Schmorl's node or compression fracture.  There is decreased T1 and T2 signal in the L1 vertebral body compatible with sclerosis.  Vertebral body height is otherwise unremarkable.  The conus medullaris is normal in signal and position.  Large T2 hyperintense lesion in the right kidney is unchanged and likely represents a cyst.  The T8-9, T9-10 and T10-11 levels are imaged in the sagittal plane only and negative.  T11-12:  Mild disc bulge and right foraminal protrusion appear unchanged.  Right foramen is mildly narrowed.  Central canal and left foramen are open.  T12-L1:  Mild disc bulge without central canal stenosis.  Right paravertebral protrusion contacting the right T12 root is unchanged.  L1-2:  Mild disc bulge and facet arthropathy are unchanged. Central canal and foramina appear open.  L2-3:  Disc bulge with some ligamentum flavum thickening and facet arthropathy appear unchanged.  Central canal and foramina are open.  L3-4:  Severe central canal stenosis is again seen.  Ligamentum flavum is markedly thickened and the disc is uncovered  and bulging due to severe facet degenerative disease.  Bilateral foraminal narrowing appears worse on the left.  The exiting left L3 root appears compressed within the foramen.  The appearance of this level is unchanged.  L4-5:  Disc bulge eccentrically prominent in the right foramen. Central canal is open.  Right worse than left foraminal narrowing is unchanged.  L5-S1:  Mild disc bulge without central canal or foraminal stenosis.  IMPRESSION:  1.  New inferior endplate compression fracture versus Schmorl's in L1 with decreased T1 and T2 signal in the vertebral body.  Signal change could be due to sclerosis from fracture healing. Correlation with PSA would be useful. 2.  No change in multilevel spondylosis, worst at L3-4 where the central canal is severely narrowed and there is right worse than left foraminal narrowing.  Please see above for detailed description of individual levels.  Original Report Authenticated By: Bernadene Bell. Maricela Curet, M.D.     No diagnosis found.    MDM  This elderly female with chronic back pain, new left proximal lower extremity pain presents with worsening of her pain.  On exam she is in no distress, though she notes significant discomfort from the leg.  Notably, the patient has only tried Tylenol recently for her pain.  On exam the patient has no focal neurologic deficits.  The patient's MRI results were reviewed and discussed with the patient and her husband. MRI is notable for stenosis as well as radicular changes.  Patient will be provided analgesics, discharged to follow up with orthopedics.     Gerhard Munch, MD 07/15/11 1626

## 2011-07-15 NOTE — ED Notes (Signed)
Patient and spouse states patient has a three week history of left upper leg pain after feeling a pop while putting on her socks.  Pain has progressively gotten worse.  Patient was seen by Dr. Modesto Charon and sent for an MRI yesterday.  Patient states Dr. Modesto Charon sent the patient here for evaluation of her leg pain and mri results.

## 2011-07-15 NOTE — Discharge Instructions (Signed)
It is extremely important that you discuss your condition with your orthopedist and primary care physician to arrange additional interventions.  If you develop any new, or concerning changes in your condition, please return to the emergency department immediately.  Your perception is waiting for your pharmacy.

## 2011-07-19 ENCOUNTER — Emergency Department (HOSPITAL_BASED_OUTPATIENT_CLINIC_OR_DEPARTMENT_OTHER)
Admission: EM | Admit: 2011-07-19 | Discharge: 2011-07-19 | Disposition: A | Discharge status: Home / Self Care | Payer: Medicare Other | Attending: Emergency Medicine | Admitting: Emergency Medicine

## 2011-07-19 ENCOUNTER — Encounter (HOSPITAL_BASED_OUTPATIENT_CLINIC_OR_DEPARTMENT_OTHER): Payer: Self-pay | Admitting: *Deleted

## 2011-07-19 DIAGNOSIS — M129 Arthropathy, unspecified: Secondary | ICD-10-CM | POA: Insufficient documentation

## 2011-07-19 DIAGNOSIS — Z79899 Other long term (current) drug therapy: Secondary | ICD-10-CM | POA: Insufficient documentation

## 2011-07-19 DIAGNOSIS — E7800 Pure hypercholesterolemia, unspecified: Secondary | ICD-10-CM | POA: Insufficient documentation

## 2011-07-19 DIAGNOSIS — I10 Essential (primary) hypertension: Secondary | ICD-10-CM | POA: Insufficient documentation

## 2011-07-19 DIAGNOSIS — M545 Low back pain, unspecified: Secondary | ICD-10-CM | POA: Insufficient documentation

## 2011-07-19 DIAGNOSIS — Z9889 Other specified postprocedural states: Secondary | ICD-10-CM | POA: Insufficient documentation

## 2011-07-19 DIAGNOSIS — M549 Dorsalgia, unspecified: Secondary | ICD-10-CM

## 2011-07-19 DIAGNOSIS — F341 Dysthymic disorder: Secondary | ICD-10-CM | POA: Insufficient documentation

## 2011-07-19 DIAGNOSIS — E039 Hypothyroidism, unspecified: Secondary | ICD-10-CM | POA: Insufficient documentation

## 2011-07-19 DIAGNOSIS — K219 Gastro-esophageal reflux disease without esophagitis: Secondary | ICD-10-CM | POA: Insufficient documentation

## 2011-07-19 DIAGNOSIS — M79609 Pain in unspecified limb: Secondary | ICD-10-CM | POA: Insufficient documentation

## 2011-07-19 MED ORDER — FENTANYL CITRATE 0.05 MG/ML IJ SOLN
50.0000 ug | Freq: Once | INTRAMUSCULAR | Status: AC
  Administered 2011-07-19: 50 ug via INTRAMUSCULAR
  Filled 2011-07-19: qty 2

## 2011-07-19 MED ORDER — HYDROCODONE-ACETAMINOPHEN 5-325 MG PO TABS: 1.0000 | ORAL_TABLET | Freq: Four times a day (QID) | ORAL | Status: AC | PRN

## 2011-07-19 MED ORDER — HYDROCODONE-ACETAMINOPHEN 5-325 MG PO TABS
1.0000 | ORAL_TABLET | Freq: Once | ORAL | Status: AC
  Administered 2011-07-19: 1 via ORAL
  Filled 2011-07-19: qty 1

## 2011-07-19 NOTE — ED Notes (Signed)
Family at bedside. 

## 2011-07-19 NOTE — ED Notes (Signed)
Pt here for continued back pain.  Pt seen here for same several times.  Pt given rx for flexeril predinsone and pt has rx for vicodin at home.  Per pt, pt has appt for epidural on tues.

## 2011-07-19 NOTE — ED Notes (Signed)
Pt presents to ED today via [facility] for chronic low back pain.  Pt has been seen here for same multiple times in the last few days.

## 2011-07-19 NOTE — ED Notes (Signed)
MD at bedside. 

## 2011-07-19 NOTE — ED Provider Notes (Signed)
History     CSN: [id number]  Arrival date & time 07/19/11  0222   First MD Initiated Contact with Patient 07/19/11 0239      Chief Complaint  Patient presents with  . Back Pain    (Consider location/radiation/quality/duration/timing/severity/associated sxs/prior treatment) HPI This is a 78 year old white female with a history of chronic low back pain. She had an acute exacerbation about 2 weeks ago. The pain is in her lumbar region and radiates to the back of the left leg and, today, less severely to the right leg. There is no associated numbness, weakness or bowel or bladder changes. The pain is improved with lying still, worse when sitting up or moving. This is her third visit to this ED since March 17 of this month. She was placed on Lyrica and Flexeril but was not prescribed a narcotic. The Lyrica had improved her pain but it has worsened over the past day. Although hydrocodone as listed as a medication prescribed by Historical Provider in Epic, she does not have any narcotic pain medication at home nor has she had any recently. She describes her pain is severe at its worst.   She is scheduled for an epidural injection by Dr. Modesto Charon tomorrow but requests pain control in the meantime.  Past Medical History  Diagnosis Date  . Chronic back pain   . Hypertension   . GERD (gastroesophageal reflux disease)   . Hypercholesteremia   . Arthritis   . Lumbar herniated disc   . Hypothyroid   . Depression   . Anxiety     Past Surgical History  Procedure Date  . Total knee arthroplasty     right  . Cholecystectomy   . Tonsillectomy     History reviewed. No pertinent family history.  History  Substance Use Topics  . Smoking status: Never Smoker   . Smokeless tobacco: Not on file  . Alcohol Use: Yes     occassionally    OB History    Grav Para Term Preterm Abortions TAB SAB Ect Mult Living                  Review of Systems  All other systems reviewed and are  negative.    Allergies  Codeine and Percocet  Home Medications   Current Outpatient Rx  Name Route Sig Dispense Refill  . AMLODIPINE BESYLATE 10 MG PO TABS Oral Take 10 mg by mouth daily.    Marland Kitchen CALCIUM CARBONATE 1250 MG PO TABS Oral Take 1 tablet by mouth daily.    Marland Kitchen CLONAZEPAM 0.5 MG PO TABS Oral Take 0.5 mg by mouth at bedtime as needed.    . CYCLOBENZAPRINE HCL 10 MG PO TABS Oral Take 1 tablet (10 mg total) by mouth 3 (three) times daily as needed for muscle spasms. 12 tablet 0  . FUROSEMIDE 40 MG PO TABS Oral Take 40 mg by mouth daily.    Marland Kitchen GABAPENTIN 100 MG PO CAPS Oral Take 1 capsule (100 mg total) by mouth 3 (three) times daily. 30 capsule 0  . HYDROCODONE-ACETAMINOPHEN 5-325 MG PO TABS Oral Take 1 tablet by mouth 2 (two) times daily as needed.    Marland Kitchen LEVOTHYROXINE SODIUM 25 MCG PO TABS Oral Take 25 mcg by mouth daily.    . MELOXICAM 7.5 MG PO TABS Oral Take 7.5 mg by mouth daily.    Marland Kitchen METHOCARBAMOL 500 MG PO TABS Oral Take 500 mg by mouth 3 (three) times daily as needed.    Marland Kitchen  METOPROLOL SUCCINATE ER 50 MG PO TB24 Oral Take 50 mg by mouth daily. Take with or immediately following a meal.    . MULTIVITAMINS PO CAPS Oral Take 1 capsule by mouth daily.    Marland Kitchen OMEPRAZOLE 20 MG PO CPDR Oral Take 20 mg by mouth daily.    Marland Kitchen POTASSIUM CHLORIDE CRYS ER 20 MEQ PO TBCR Oral Take 20 mEq by mouth 2 (two) times daily.    Marland Kitchen PREDNISONE 20 MG PO TABS Oral Take 2 tablets (40 mg total) by mouth daily. 10 tablet 0  . ROSUVASTATIN CALCIUM 10 MG PO TABS Oral Take 10 mg by mouth daily.    . SERTRALINE HCL 25 MG PO TABS Oral Take 25 mg by mouth daily.      BP 136/81  Pulse 88  Temp(Src) 98.5 F (36.9 C) (Oral)  Resp 20  SpO2 96%  Physical Exam General: Well-developed, well-nourished female in no acute distress; appearance consistent with age of record HENT: normocephalic, atraumatic Eyes: pupils equal round and reactive to light; extraocular muscles intact Neck: supple Heart: regular rate and  rhythm; distant sound Lungs: Normal respiratory effort and excursion; distant sounds Abdomen: soft; nondistended; nontender Back: Nontender; pain on flexion of lower back; negative straight leg raise bilaterally Extremities: No deformity; full range of motion; pulses normal Neurologic: Awake, alert and oriented; motor function intact in all extremities and symmetric; no facial droop; sensation Skin: Warm and dry     ED Course  Procedures (including critical care time)     MDM  4:32 AM Patient's pain and flexibility improved after fentanyl 50 mcg IM. Review of patient's medications and records indicate she is not currently on any narcotic medication. We will provide narcotic pain medication. She has an appointment with Dr. Modesto Charon for epidural injection tomorrow.          Hanley Seamen, MD 07/19/11 [id number]

## 2013-06-10 ENCOUNTER — Encounter (HOSPITAL_BASED_OUTPATIENT_CLINIC_OR_DEPARTMENT_OTHER): Payer: Self-pay | Admitting: Emergency Medicine

## 2013-06-10 ENCOUNTER — Emergency Department (HOSPITAL_BASED_OUTPATIENT_CLINIC_OR_DEPARTMENT_OTHER)
Admission: EM | Admit: 2013-06-10 | Discharge: 2013-06-10 | Disposition: A | Discharge status: Home / Self Care | Payer: Medicare Other | Attending: Emergency Medicine | Admitting: Emergency Medicine

## 2013-06-10 ENCOUNTER — Emergency Department (HOSPITAL_BASED_OUTPATIENT_CLINIC_OR_DEPARTMENT_OTHER): Payer: Medicare Other

## 2013-06-10 DIAGNOSIS — R296 Repeated falls: Secondary | ICD-10-CM | POA: Insufficient documentation

## 2013-06-10 DIAGNOSIS — K219 Gastro-esophageal reflux disease without esophagitis: Secondary | ICD-10-CM | POA: Insufficient documentation

## 2013-06-10 DIAGNOSIS — Y939 Activity, unspecified: Secondary | ICD-10-CM | POA: Insufficient documentation

## 2013-06-10 DIAGNOSIS — M79672 Pain in left foot: Secondary | ICD-10-CM

## 2013-06-10 DIAGNOSIS — S0081XA Abrasion of other part of head, initial encounter: Secondary | ICD-10-CM

## 2013-06-10 DIAGNOSIS — S99929A Unspecified injury of unspecified foot, initial encounter: Principal | ICD-10-CM

## 2013-06-10 DIAGNOSIS — IMO0002 Reserved for concepts with insufficient information to code with codable children: Secondary | ICD-10-CM | POA: Insufficient documentation

## 2013-06-10 DIAGNOSIS — S8990XA Unspecified injury of unspecified lower leg, initial encounter: Secondary | ICD-10-CM | POA: Insufficient documentation

## 2013-06-10 DIAGNOSIS — M129 Arthropathy, unspecified: Secondary | ICD-10-CM | POA: Insufficient documentation

## 2013-06-10 DIAGNOSIS — F329 Major depressive disorder, single episode, unspecified: Secondary | ICD-10-CM | POA: Insufficient documentation

## 2013-06-10 DIAGNOSIS — F411 Generalized anxiety disorder: Secondary | ICD-10-CM | POA: Insufficient documentation

## 2013-06-10 DIAGNOSIS — W19XXXA Unspecified fall, initial encounter: Secondary | ICD-10-CM

## 2013-06-10 DIAGNOSIS — F3289 Other specified depressive episodes: Secondary | ICD-10-CM | POA: Insufficient documentation

## 2013-06-10 DIAGNOSIS — Z79899 Other long term (current) drug therapy: Secondary | ICD-10-CM | POA: Insufficient documentation

## 2013-06-10 DIAGNOSIS — I10 Essential (primary) hypertension: Secondary | ICD-10-CM | POA: Insufficient documentation

## 2013-06-10 DIAGNOSIS — G8929 Other chronic pain: Secondary | ICD-10-CM | POA: Insufficient documentation

## 2013-06-10 DIAGNOSIS — S99919A Unspecified injury of unspecified ankle, initial encounter: Principal | ICD-10-CM

## 2013-06-10 DIAGNOSIS — Y929 Unspecified place or not applicable: Secondary | ICD-10-CM | POA: Insufficient documentation

## 2013-06-10 DIAGNOSIS — E039 Hypothyroidism, unspecified: Secondary | ICD-10-CM | POA: Insufficient documentation

## 2013-06-10 NOTE — ED Notes (Signed)
Patient ambulated to restroom with walker.

## 2013-06-10 NOTE — Discharge Instructions (Signed)
Return to the ED with any concerns including increased pain/swelling/discoloration of foot or toes, vomiting, seizure activity, decreased level of alertness/lethargy, or any other alarming symptoms  The xray showed no acute abnormalities.  It is possible for fractures to show up later after they have begun healing, it is important to followup with your doctor if you continue to have pain for this reason

## 2013-06-10 NOTE — ED Provider Notes (Signed)
CSN: [id number]     Arrival date & time 06/10/13  [id number] History   None    Chief Complaint  Patient presents with  . Fall     (Consider location/radiation/quality/duration/timing/severity/associated sxs/prior Treatment) HPI Pt presenting with c/o pain in left foot after fall this morning.  She walks with a walker at baseline and has arthritis, she states she did not lose consciousness either before or after the fall.  No chest pain or palpitaions.  She feels her walker got caught on the floor causing her to fall.  Denies neck or back pain.  She does have an abrasion on her left cheek she states she hit cheek on carpet.  No vomiting or seizure activity.  She has been able to bear weight and walk with her walker as before.  Has not had anything for her symptoms prior to arrival.  There are no other associated systemic symptoms, there are no other alleviating or modifying factors. Pt does not take anticoagulants.   Past Medical History  Diagnosis Date  . Chronic back pain   . Hypertension   . GERD (gastroesophageal reflux disease)   . Hypercholesteremia   . Arthritis   . Lumbar herniated disc   . Hypothyroid   . Depression   . Anxiety    Past Surgical History  Procedure Laterality Date  . Total knee arthroplasty      right  . Cholecystectomy    . Tonsillectomy     No family history on file. History  Substance Use Topics  . Smoking status: Never Smoker   . Smokeless tobacco: Not on file  . Alcohol Use: Yes     Comment: occassionally   OB History   Grav Para Term Preterm Abortions TAB SAB Ect Mult Living                 Review of Systems ROS reviewed and all otherwise negative except for mentioned in HPI    Allergies  Codeine and Percocet  Home Medications   Current Outpatient Rx  Name  Route  Sig  Dispense  Refill  . amLODipine (NORVASC) 10 MG tablet   Oral   Take 10 mg by mouth daily.         . calcium carbonate (OS-CAL - DOSED IN MG OF ELEMENTAL CALCIUM)  1250 MG tablet   Oral   Take 1 tablet by mouth daily.         . clonazePAM (KLONOPIN) 0.5 MG tablet   Oral   Take 0.5 mg by mouth at bedtime as needed.         . furosemide (LASIX) 40 MG tablet   Oral   Take 40 mg by mouth daily.         Marland Kitchen. HYDROcodone-acetaminophen (NORCO) 5-325 MG per tablet   Oral   Take 1 tablet by mouth 2 (two) times daily as needed.         Marland Kitchen. levothyroxine (SYNTHROID, LEVOTHROID) 25 MCG tablet   Oral   Take 25 mcg by mouth daily.         . methocarbamol (ROBAXIN) 500 MG tablet   Oral   Take 500 mg by mouth 3 (three) times daily as needed.         . metoprolol succinate (TOPROL-XL) 50 MG 24 hr tablet   Oral   Take 50 mg by mouth daily. Take with or immediately following a meal.         . Multiple Vitamin (MULTIVITAMIN) capsule  Oral   Take 1 capsule by mouth daily.         Marland Kitchen omeprazole (PRILOSEC) 20 MG capsule   Oral   Take 20 mg by mouth daily.         . potassium chloride SA (K-DUR,KLOR-CON) 20 MEQ tablet   Oral   Take 20 mEq by mouth 2 (two) times daily.         . rosuvastatin (CRESTOR) 10 MG tablet   Oral   Take 10 mg by mouth daily.         . sertraline (ZOLOFT) 25 MG tablet   Oral   Take 25 mg by mouth daily.          BP 145/73  Pulse 98  Temp(Src) 97.4 F (36.3 C) (Oral)  Resp 19  Wt 98 lb (44.453 kg)  SpO2 98% Vitals reviewed Physical Exam Physical Examination: General appearance - alert, well appearing, and in no distress Mental status - alert, oriented to person, place, and time Head- NCAT FAce- no ttp over maxillary and orbital prominences, no ttp over nasal bridge, superficial abrasion overlying left cheek Eyes - pupils equal and reactive, no conjunctival injection, no scleral icterus Neck - supple, no significant adenopathy Chest - clear to auscultation, no wheezes, rales or rhonchi, symmetric air entry Heart - normal rate, regular rhythm, normal S1, S2, no murmurs, rubs, clicks or  gallops Abdomen - soft, nontender, nondistended, no masses or organomegaly Back exam - full range of motion, no tenderness, palpable spasm or pain on motion Neurological - alert, oriented, normal speech, strength 5/5 in extremities, sensation intact distally, normal gait for this patient with chronic arthritis- walks with walker at baseline Musculoskeletal - mild ttp over distal 5th metatarsal, otherwise no joint tenderness, deformity or swelling Extremities - peripheral pulses normal, no pedal edema, no clubbing or cyanosis Skin - normal coloration and turgor, no rashes  ED Course  Procedures (including critical care time) Labs Review Labs Reviewed - No data to display Imaging Review Dg Foot Complete Left  06/10/2013   CLINICAL DATA:  Post fall, now with lateral foot pain  EXAM: LEFT FOOT - COMPLETE 3+ VIEW  COMPARISON:  None.  FINDINGS: Osteopenia. No definite displaced fracture or dislocation. There is degenerative change of multiple midfoot articulations. Mild hallucis valgus deformity with associated mild degenerative change. Vascular calcifications. Regional soft tissues are normal. No radiopaque foreign body.  IMPRESSION: Osteopenia without definite fracture or dislocation.   Electronically Signed   By: Simonne Come M.D.   On: 06/10/2013 11:12    EKG Interpretation   None       MDM   Final diagnoses:  Fall  Left foot pain  Abrasion of face    Pt presenting with c/o fall and resultant abrasion to face- low suspicion for intracranial injury or significant facial trauma.  Mild tpp over left foot- xray reassuring-  Xray images reviewed and interpreted by me as well. Discharged with strict return precautions.  Pt agreeable with plan.      Ethelda Chick, MD 06/11/13 [id number]

## 2013-06-10 NOTE — ED Notes (Signed)
Patient here with left foot pain and mild abrasion to right side of face after getting unsteady and falling this am. No loc. Uses walker at home for ambulation

## 2017-02-24 ENCOUNTER — Ambulatory Visit (INDEPENDENT_AMBULATORY_CARE_PROVIDER_SITE_OTHER): Payer: Medicare Other | Admitting: Neurology

## 2017-02-24 ENCOUNTER — Encounter: Payer: Self-pay | Admitting: Neurology

## 2017-02-24 VITALS — BP 128/81 | Ht <= 58 in | Wt 127.0 lb

## 2017-02-24 DIAGNOSIS — G20C Parkinsonism, unspecified: Secondary | ICD-10-CM

## 2017-02-24 DIAGNOSIS — G20 Parkinson's disease: Secondary | ICD-10-CM | POA: Diagnosis not present

## 2017-02-24 MED ORDER — CARBIDOPA-LEVODOPA 25-100 MG PO TABS: ORAL_TABLET | ORAL | 3 refills | Status: DC

## 2017-02-24 NOTE — Progress Notes (Signed)
Subjective:    Patient ID: Kelly Erickson is a 83 y.o. female.  HPI     Kelly FoleySaima Meaghann Choo, MD, PhD [facility] [address], [address] [address] [place], [place] [id number] Dear Dr. Derrell LollingJobe,   I saw your patient, Kelly Erickson, upon your kind request in my neurologic clinic today for initial consultation of her parkinsonism. The patient is accompanied by her daughter Kelly Erickson today. As you know, Kelly Erickson is an 83 year old right-handed woman with an underlying medical history of lumbar spine disease and chronic back pain, reflux disease, hypertension, arthritis, depression, anxiety, hypothyroidism, status post right total knee replacement, osteoporosis, prediabetes, and diverticulosis, who has [facility] been [facility] for parkinsonism. She has been on Sinemet for some time, probably since 2015. She was last seen at [facility] [place] on 07/16/2016. She has a longer standing history of gait disorder going back 3-4 years. She has fallen in the past. She has been using a walker. She has had some issues with constipation. She has been on Sinemet for at least 3 years. She was tried in the interim on Rytary, but had side effects. Currently, she is on Sinemet 25-100 milligrams strength 2 pills in the morning, 1 at lunchtime, 2 pills in the evenings. She lives at home with her husband who is also 81 years old and has Alzheimer's disease. They have a caretaker at the house at all times. She has a total of 5 children, 2 sons and [place], her daughter and [place], 1 son in [place] and one son in [place]. She is a nonsmoker. She does not drink caffeine with the exception of hot tea in the morning. She does not typically drink coffee or soda. She does not always drink a lot of water. She estimates that she drinks about 24 ounces of water per day. She has not had any recent falls thankfully. She uses a rolling walker inside the house. She does not exercise on her  day-to-day basis. She wakes up often with leg cramps, particularly on the left side. She had no significant one-sided issues in the beginning. She recalls that she had gait problems starting in 2015, she had injured her left shoulder after a fall. This required no surgery, she had therapy for this. She does not have much in the way of tremor. I was able to review [facility] [place]. She sees pain management for her back pain. She had back surgery. She had right total knee replacement surgery.  Her Past Medical History Is Significant For: Past Medical History:  Diagnosis Date  . Anxiety   . Arthritis   . Chronic back pain   . Depression   . GERD (gastroesophageal reflux disease)   . Hypercholesteremia   . Hypertension   . Hypothyroid   . Lumbar herniated disc   . Movement disorder    Parkinson disease    Her Past Surgical History Is Significant For: Past Surgical History:  Procedure Laterality Date  . CHOLECYSTECTOMY    . melanoma remove from head    . TONSILLECTOMY    . TOTAL KNEE ARTHROPLASTY     right    Her Family History Is Significant For: History reviewed. No pertinent family history.  Her Social History Is Significant For: Social History   Social History  . Marital status: Married    Spouse name: N/A  . Number of children: N/A  . Years of education: N/A  Social History Main Topics  . Smoking status: Never Smoker  . Smokeless tobacco: Never Used  . Alcohol use Yes     Comment: occassionally  . Drug use: No  . Sexual activity: Not Asked   Other Topics Concern  . None   Social History Narrative  . None    Her Allergies Are:  Allergies  Allergen Reactions  . Codeine   . Percocet [Oxycodone-Acetaminophen]   :   Her Current Medications Are:  Outpatient Encounter Prescriptions as of 02/24/2017  Medication Sig  . amLODipine (NORVASC) 10 MG tablet Take 10 mg by mouth daily.  Marland Kitchen atorvastatin (LIPITOR) 10 MG tablet Take 10  mg by mouth.  . carbidopa-levodopa (SINEMET IR) 25-100 MG tablet TAKE 2 TABLETS BY MOUTH IN THE MORNING, 1 TABLET AT LUNCH, AND 2 TABLETS IN THE EVENING  . furosemide (LASIX) 20 MG tablet TAKE 1 TABLET (20 MG TOTAL) BY MOUTH DAILY.  Marland Kitchen gabapentin (NEURONTIN) 100 MG capsule TAKE 1 CAPSULE (100 MG TOTAL) BY MOUTH DAILY.  Marland Kitchen HYDROcodone-acetaminophen (NORCO) 7.5-325 MG tablet   . levothyroxine (SYNTHROID, LEVOTHROID) 25 MCG tablet Take 25 mcg by mouth daily.  . Melatonin 1 MG TABS Take 1 mg by mouth as needed.   . Multiple Vitamin (MULTIVITAMIN) capsule Take 1 capsule by mouth daily.  Marland Kitchen omeprazole (PRILOSEC) 20 MG capsule Take 20 mg by mouth daily.  . potassium chloride SA (K-DUR,KLOR-CON) 20 MEQ tablet Take 20 mEq by mouth 2 (two) times daily.  . sertraline (ZOLOFT) 25 MG tablet Take 25 mg by mouth daily.  . [DISCONTINUED] meloxicam (MOBIC) 7.5 MG tablet Take 7.5 mg by mouth daily. with food  . [DISCONTINUED] alendronate (FOSAMAX) 70 MG tablet Take 70 mg by mouth.  . [DISCONTINUED] amLODipine (NORVASC) 5 MG tablet TAKE 1 TABLET (5 MG TOTAL) BY MOUTH DAILY.  . [DISCONTINUED] calcium carbonate (OS-CAL - DOSED IN MG OF ELEMENTAL CALCIUM) 1250 MG tablet Take 1 tablet by mouth daily.  . [DISCONTINUED] clonazePAM (KLONOPIN) 0.5 MG tablet Take 0.5 mg by mouth at bedtime as needed.  . [DISCONTINUED] furosemide (LASIX) 40 MG tablet Take 40 mg by mouth daily.  . [DISCONTINUED] gabapentin (NEURONTIN) 100 MG capsule Take 100 mg by mouth.  . [DISCONTINUED] HYDROcodone-acetaminophen (NORCO) 5-325 MG per tablet Take 1 tablet by mouth 2 (two) times daily as needed.  . [DISCONTINUED] methocarbamol (ROBAXIN) 500 MG tablet Take 500 mg by mouth 3 (three) times daily as needed.  . [DISCONTINUED] metoprolol succinate (TOPROL-XL) 50 MG 24 hr tablet Take 50 mg by mouth daily. Take with or immediately following a meal.  . [DISCONTINUED] potassium chloride SA (KLOR-CON M20) 20 MEQ tablet Take by mouth.  . [DISCONTINUED]  rosuvastatin (CRESTOR) 10 MG tablet Take 10 mg by mouth daily.  . [DISCONTINUED] sertraline (ZOLOFT) 25 MG tablet TAKE 1 TABLET BY MOUTH EVERY DAY   No facility-administered encounter medications on file as of 02/24/2017.   : Review of Systems:  Out of a complete 14 point review of systems, all are reviewed and negative with the exception of these symptoms as listed below: Review of Systems  Musculoskeletal:       Cramps  Skin:       Birth marks  Neurological:       Snoring  Patient is here for establish of care. Pt was going to neurologist in [facility]. The MD left the office. Pt has Parkinson disease.  Psychiatric/Behavioral:       Depession    Objective:  Neurological Exam  Physical Exam Physical Examination:   Vitals:   02/24/17 1436  BP: 128/81    General Examination: The patient is a very pleasant 83 y.o. female in no acute distress. She appears well-developed and well-nourished and well groomed.   HEENT: Normocephalic, atraumatic, pupils are equal, round and reactive to light and accommodation. Extraocular tracking is mildly impaired, face is symmetric, mild facial masking as noted, hearing is grossly intact. Neck is mildly rigid, she has known lip, neck or jaw tremor. Airway examination reveals mild to moderate mouth dryness, tongue protrudes centrally and palate elevates symmetrically. She has mild hypophonia, no dysarthria.   Chest: Clear to auscultation without wheezing, rhonchi or crackles noted.  Heart: S1+S2+0, regular and normal without murmurs, rubs or gallops noted.   Abdomen: Soft, non-tender and non-distended with normal bowel sounds appreciated on auscultation.  Extremities: There is no pitting edema in the distal lower extremities bilaterally. Puffy ankles, nonpitting.   Skin: Warm and dry without trophic changes.  Musculoskeletal: exam reveals decreased range of motion in both shoulders, left worse than right. She has decreased range of motion in the  right knee.   Neurologically:  Mental status: The patient is awake, alert and oriented in all 4 spheres. Her immediate and remote memory, attention, language skills and fund of knowledge are appropriate. There is no evidence of aphasia, agnosia, apraxia or anomia. Speech is clear with normal prosody and enunciation. Thought process is linear. Mood is normal and affect is normal.  Cranial nerves II - XII are as described above under HEENT exam. In addition: shoulder shrug is  limited bilaterally, left worse than right.  Motor exam: Normal bulk,  global strength of 4+ out of 5, no resting tremor. She has no action or postural tremor. Romberg is not testable safely. Reflexes are 2+ in the upper extremities, 1+ in the lower extremities. Fine motor skills with finger taps, hand movements and rapid alternating patting are mildly impaired bilaterally, mild lateralization to the left noted. Foot agility and foot taps are mildly impaired, also perhaps mildly lateralized to the left. She stands up with mild difficulty and pushes herself up, requires 2 attempts. Posture is mildly stooped, could be age-appropriate. She is able to walk without the walker for a little bit, walks cautiously and slowly, no shuffling, I'll decrease in arm swing on the left noted. She turns in 3 steps. Balance is mildly impaired, tandem walk is not testable safely.   Assessment and Plan:  In summary, Kelly Erickson is a very pleasant 83 y.o.-year old female with an underlying medical history of lumbar spine disease and chronic back pain, reflux disease, hypertension, arthritis, depression, anxiety, hypothyroidism, status post right total knee replacement, osteoporosis, prediabetes, and diverticulosis, who presents for consultation of her parkinsonism and to establish care. She has been on Sinemet for the past 3 years at least. Symptoms date back to around 2015 when she was noted to have gait difficulty and had a fall. Her history does not  suggest tremor predominance. She has mild signs of parkinsonism, mild lateralization to the left. She has responded to medication. She is advised to continue with Sinemet 25-100 milligrams strength 2 pills in the morning, 1 midday and 2 pills in the evenings. She is advised to utilize her walker at all times but also try to exercise in the form of walking with her walker on a day-to-day basis. She is furthermore advised to be very proactive about constipation issues. Mood and memory-wise she looks well. She has  not had any recent fall. She is advised to hydrate a little better with water. I suggested a four-month recheck, I renewed her prescription for Sinemet.  I answered all their questions today and the patient and her daughter were in agreement.  Thank you very much for allowing me to participate in the care of this nice patient. If I can be of any further assistance to you please do not hesitate to call me at [phone].  Sincerely,   Kelly Foley, MD, PhD

## 2017-02-24 NOTE — Patient Instructions (Signed)
I think you are doing fairly well on the current dose of Sinemet.   Please be really proactive with your constipation medication regimen, titrating as needed to where you have a formed stool at least every other day.   Please try to stay active more, maybe in the form of walking with your walker and also may need to increase your water intake.

## 2017-06-27 ENCOUNTER — Ambulatory Visit: Payer: Medicare Other | Admitting: Neurology

## 2017-09-06 ENCOUNTER — Encounter: Payer: Self-pay | Admitting: Neurology

## 2017-09-06 ENCOUNTER — Ambulatory Visit (INDEPENDENT_AMBULATORY_CARE_PROVIDER_SITE_OTHER): Payer: Medicare Other | Admitting: Neurology

## 2017-09-06 ENCOUNTER — Encounter

## 2017-09-06 VITALS — BP 78/51 | HR 102 | Ht 60.0 in | Wt 125.0 lb

## 2017-09-06 DIAGNOSIS — G20 Parkinson's disease: Secondary | ICD-10-CM | POA: Diagnosis not present

## 2017-09-06 DIAGNOSIS — G20C Parkinsonism, unspecified: Secondary | ICD-10-CM

## 2017-09-06 NOTE — Progress Notes (Signed)
Subjective:    Patient ID: Kelly Erickson is a 84 y.o. female.  HPI     Interim history:  Kelly Erickson is an 84 year old right-handed woman with an underlying medical history of lumbar spine disease and chronic back pain, melanoma, reflux disease, hypertension, arthritis, depression, anxiety, hypothyroidism, status post right total knee replacement, osteoporosis, prediabetes, and diverticulosis, who presents for follow-up consultation of her parkinsonism. The patient is accompanied by her son from [place], today. She missed an appointment on 06/27/17 due to acute illness and went to the ER. I first met her on 02/24/2017 at the request of her primary care physician, at which time she needed to transition care to another neurologist. She was stable on her dose of Sinemet 2 pills in the morning, 1 midday and 2 pills in the evening.   Today, 09/06/2017: She reports feeling about the same. Her blood pressure is low today. She admits that she has not had much to drink today. She likes to drink hot tea. She had one cup of tea today. Yesterday, she had 3 cups of tea, maybe 1 large cup of water altogether. She is not fully clear as to how much water she actually drinks on a day-to-day basis. She has not had any recent exacerbation of her motor symptoms, she was diagnosed with melanoma last year, she had a port placed for chemotherapy.   The patient's allergies, current medications, family history, past medical history, past social history, past surgical history and problem list were reviewed and updated as appropriate.   Previously:  02/24/2017: (She) has previously been followed at [facility] for parkinsonism. She has been on Sinemet for some time, probably since 2015. She was last seen at [place] [place] on 07/16/2016. She has a longer standing history of gait disorder going back 3-4 years. She has fallen in the past. She has been using a walker. She has had some issues with constipation.  She has been on Sinemet for at least 3 years. She was tried in the interim on Rytary, but had side effects. Currently, she is on Sinemet 25-100 milligrams strength 2 pills in the morning, 1 at lunchtime, 2 pills in the evenings. She lives at home with her husband who is also 77 years old and has Alzheimer's disease. They have a caretaker at the house at all times. She has a total of 5 children, 2 sons in [place], daughter in [place], 1 son in [place] and one son in [place]. She is a nonsmoker. She does not drink caffeine with the exception of hot tea in the morning. She does not typically drink coffee or soda. She does not always drink a lot of water. She estimates that she drinks about 24 ounces of water per day. She has not had any recent falls thankfully. She uses a rolling walker inside the house. She does not exercise on her day-to-day basis. She wakes up often with leg cramps, particularly on the left side. She had no significant one-sided issues in the beginning. She recalls that she had gait problems starting in 2015, she had injured her left shoulder after a fall. This required no surgery, she had therapy for this. She does not have much in the way of tremor. I was able to review records from [facility] in [place]. She sees pain management for her back pain. She had back surgery. She had right total knee replacement surgery.  His Past Medical History Is Significant For: Past Medical History:  Diagnosis Date  . Anxiety   . Arthritis   . Chronic back pain   . Depression   . GERD (gastroesophageal reflux disease)   . Hypercholesteremia   . Hypertension   . Hypothyroid   . Lumbar herniated disc   . Movement disorder    Parkinson disease    His Past Surgical History Is Significant For: Past Surgical History:  Procedure Laterality Date  . CHOLECYSTECTOMY    . melanoma remove from head    . TONSILLECTOMY    . TOTAL KNEE ARTHROPLASTY     right    His Family History Is  Significant For: No family history on file.  His Social History Is Significant For: Social History   Socioeconomic History  . Marital status: Married    Spouse name: Not on file  . Number of children: Not on file  . Years of education: Not on file  . Highest education level: Not on file  Occupational History  . Not on file  Social Needs  . Financial resource strain: Not on file  . Food insecurity:    Worry: Not on file    Inability: Not on file  . Transportation needs:    Medical: Not on file    Non-medical: Not on file  Tobacco Use  . Smoking status: Never Smoker  . Smokeless tobacco: Never Used  Substance and Sexual Activity  . Alcohol use: Yes    Comment: occassionally  . Drug use: No  . Sexual activity: Not on file  Lifestyle  . Physical activity:    Days per week: Not on file    Minutes per session: Not on file  . Stress: Not on file  Relationships  . Social connections:    Talks on phone: Not on file    Gets together: Not on file    Attends religious service: Not on file    Active member of club or organization: Not on file    Attends meetings of clubs or organizations: Not on file    Relationship status: Not on file  Other Topics Concern  . Not on file  Social History Narrative  . Not on file    His Allergies Are:  Allergies  Allergen Reactions  . Codeine   . Percocet [Oxycodone-Acetaminophen]   :   His Current Medications Are:  Outpatient Encounter Medications as of 09/06/2017  Medication Sig  . alendronate (FOSAMAX) 70 MG tablet Take 70 mg by mouth once a week. Take with a full glass of water on an empty stomach.  Marland Kitchen amLODipine (NORVASC) 5 MG tablet Take 5 mg by mouth daily.  Marland Kitchen atorvastatin (LIPITOR) 10 MG tablet Take 10 mg by mouth.  . carbidopa-levodopa (SINEMET IR) 25-100 MG tablet 2 in AM, 1 midday and 2 in the evening daily  . Cholecalciferol (VITAMIN D3) 2000 units TABS Take 2,000 Units by mouth daily.  . furosemide (LASIX) 20 MG tablet  TAKE 1 TABLET (20 MG TOTAL) BY MOUTH DAILY.  Marland Kitchen gabapentin (NEURONTIN) 100 MG capsule TAKE 1 CAPSULE (100 MG TOTAL) BY MOUTH DAILY.  Marland Kitchen HYDROcodone-acetaminophen (NORCO) 7.5-325 MG tablet Take 1 tablet by mouth every 8 (eight) hours as needed.   Marland Kitchen levothyroxine (SYNTHROID, LEVOTHROID) 25 MCG tablet Take 25 mcg by mouth daily.  Marland Kitchen loperamide (IMODIUM A-D) 2 MG tablet Take 2 mg by mouth every 4 (four) hours as needed for diarrhea or loose stools.  . Melatonin 3 MG TABS Take 3 mg by mouth at bedtime.  Marland Kitchen  meloxicam (MOBIC) 7.5 MG tablet Take 7.5 mg by mouth daily.  . Multiple Vitamin (MULTIVITAMIN) capsule Take 1 capsule by mouth daily.  Marland Kitchen omeprazole (PRILOSEC) 20 MG capsule Take 20 mg by mouth daily.  . potassium chloride SA (K-DUR,KLOR-CON) 20 MEQ tablet Take 20 mEq by mouth 2 (two) times daily.  . sertraline (ZOLOFT) 25 MG tablet Take 25 mg by mouth daily.  . [DISCONTINUED] amLODipine (NORVASC) 10 MG tablet Take 10 mg by mouth daily.  . [DISCONTINUED] Melatonin 1 MG TABS Take 1 mg by mouth as needed.    No facility-administered encounter medications on file as of 09/06/2017.   :  Review of Systems:  Out of a complete 14 point review of systems, all are reviewed and negative with the exception of these symptoms as listed below:  Review of Systems  Neurological:       Pt presents today to follow up on her parkinsonism. Pt recently had a power port placement.    Objective:  Neurological Exam  Physical Exam Physical Examination:   Vitals:   09/06/17 1145  BP: (!) 78/51  Pulse: (!) 102    General Examination: The patient is a very pleasant 84 y.o. female in no acute distress. She appears well-developed and well-nourished and well groomed. She is quieter today.   HEENT: Normocephalic, atraumatic, pupils are equal, round and reactive to light and accommodation. Extraocular tracking is mildly impaired, face is symmetric, mild facial masking as noted, hearing is grossly intact. Neck is mildly  rigid, she has no lip, neck or jaw tremor. Airway examination reveals mild to moderate mouth dryness, tongue protrudes centrally and palate elevates symmetrically. She has mild hypophonia, no dysarthria.   Chest: Clear to auscultation without wheezing, rhonchi or crackles noted. Port in place left upper chest.   Heart: S1+S2+0, regular and normal without murmurs, rubs or gallops noted.   Abdomen: Soft, non-tender and non-distended with normal bowel sounds appreciated on auscultation.  Extremities: There is no pitting edema in the distal lower extremities bilaterally. Puffy ankles, nonpitting.   Skin: Warm and dry without trophic changes.  Musculoskeletal: exam reveals decreased range of motion in both shoulders, left worse than right. She has decreased range of motion in the right knee, fairly stable.   Neurologically:  Mental status: The patient is awake, alert and oriented in all 4 spheres. Her immediate and remote memory, attention, language skills and fund of knowledge are fairly appropriate. There is no evidence of aphasia, agnosia, apraxia or anomia. Mood is normal and affect is normal.  Cranial nerves II - XII are as described above under HEENT exam. In addition: shoulder shrug is  limited bilaterally, left worse than right.  Motor exam: Normal bulk,  global strength of 4+ out of 5, no resting tremor. She has no action or postural tremor. Romberg is not testable safely. Fine motor skills are globally mildly impaired, with some lateralization to the left.  Gait, station and balance, she stands up with difficulty, posture is stooped, she uses a rolling walker.  Balance is mildly impaired.   Assessment and Plan:  In summary, Ericka Marcellus is a very pleasant 84 year old female with an underlying medical history of lumbar spine disease and chronic back pain, reflux disease, hypertension, arthritis, depression, anxiety, hypothyroidism, status post right total knee replacement,  osteoporosis, prediabetes, diverticulosis, Malignant melanoma for which she is on treatment, who presents for consultation of her parkinsonism. She has been on Sinemet, her diagnosis dates back to 50. She has been  on Sinemet 25-100 milligrams strength 2 pills in the morning, one midday and 2 at night. Today I suggested we scale back on the Sinemet because of low blood pressure and recent diagnosis of melanoma. Some studies have suggested a link between Parkinson's disease and melanoma or taking Parkinson's medication and melanoma. It is not fully clear if Parkinson's itself increases the risk for melanoma or the medications that are used for Parkinson's disease may increase the risk for melanoma. Nevertheless, in her case I would like to reduce her Sinemet to 1 pill 3 times a day. She is agreeable. She is advised to be watchful of lightheadedness, be mindful of her water intake, she may also noticed motor repercussions as she reduces her Sinemet. She is advised to look for recurrence of tremor, difficulty with fine motor control, slowness and stiffness and difficulty walking. She is advised to increase her water intake and limit her tea intake to 1-2 servings per day and instead try to drink 6 cups of water possible. She is advised to follow-up in about 4 months to see one of our nurse practitioners. I answered all their questions today and the patient and her son were in agreement. I spent 20 minutes in total face-to-face time with the patient, more than 50% of which was spent in counseling and coordination of care, reviewing test results, reviewing medication and discussing or reviewing the diagnosis of PD, its prognosis and treatment options. Pertinent laboratory and imaging test results that were available during this visit with the patient were reviewed by me and considered in my medical decision making (see chart for details).

## 2017-09-06 NOTE — Patient Instructions (Addendum)
Please hydrate better with water.  Limit your tea to 1-2 servings per day. Please reduce your Sinemet to 1 pill 3 times a day. As discussed, some reports have suggested that patients with Parkinson's disease may have a slightly higher risk to develop skin cancers including melanoma compared to the general population. Whether or not it is medication related or due to the fact of having Parkinson's disease is not fully clear. Your blood pressure is low today. Please monitor your symptoms for lightheadedness; drink more water, change positions slowly.

## 2017-12-28 NOTE — Progress Notes (Deleted)
[facility]  PATIENT: Kelly Erickson DOB: [birth year]   REASON FOR VISIT: Follow-up for Parkinson's disease HISTORY FROM:    HISTORY OF PRESENT ILLNESS: Ms. Persichetti is an 84 year old right-handed woman with an underlying medical history of lumbar spine disease and chronic back pain, melanoma, reflux disease, hypertension, arthritis, depression, anxiety, hypothyroidism, status post right total knee replacement, osteoporosis, prediabetes, and diverticulosis, who presents for follow-up consultation of her parkinsonism. The patient is accompanied by her son from [place], today. She missed an appointment on 06/27/17 due to acute illness and went to the ER. I first met her on 02/24/2017 at the request of her primary care physician, at which time she needed to transition care to another neurologist. She was stable on her dose of Sinemet 2 pills in the morning, 1 midday and 2 pills in the evening.   Today, 09/06/2017: She reports feeling about the same. Her blood pressure is low today. She admits that she has not had much to drink today. She likes to drink hot tea. She had one cup of tea today. Yesterday, she had 3 cups of tea, maybe 1 large cup of water altogether. She is not fully clear as to how much water she actually drinks on a day-to-day basis. She has not had any recent exacerbation of her motor symptoms, she was diagnosed with melanoma last year, she had a port placed for chemotherapy.    REVIEW OF SYSTEMS: Full 14 system review of systems performed and notable only for those listed, all others are neg:  Constitutional: neg  Cardiovascular: neg Ear/Nose/Throat: neg  Skin: neg Eyes: neg Respiratory: neg Gastroitestinal: neg  Hematology/Lymphatic: neg  Endocrine: neg Musculoskeletal:neg Allergy/Immunology: neg Neurological: neg Psychiatric: neg Sleep : neg   ALLERGIES: Allergies  Allergen Reactions  . Codeine   . Percocet [Oxycodone-Acetaminophen]     HOME  MEDICATIONS: Outpatient Medications Prior to Visit  Medication Sig Dispense Refill  . alendronate (FOSAMAX) 70 MG tablet Take 70 mg by mouth once a week. Take with a full glass of water on an empty stomach.    Marland Kitchen amLODipine (NORVASC) 5 MG tablet Take 5 mg by mouth daily.    Marland Kitchen atorvastatin (LIPITOR) 10 MG tablet Take 10 mg by mouth.    . carbidopa-levodopa (SINEMET IR) 25-100 MG tablet 2 in AM, 1 midday and 2 in the evening daily 450 tablet 3  . Cholecalciferol (VITAMIN D3) 2000 units TABS Take 2,000 Units by mouth daily.    . furosemide (LASIX) 20 MG tablet TAKE 1 TABLET (20 MG TOTAL) BY MOUTH DAILY.  3  . gabapentin (NEURONTIN) 100 MG capsule TAKE 1 CAPSULE (100 MG TOTAL) BY MOUTH DAILY.  3  . HYDROcodone-acetaminophen (NORCO) 7.5-325 MG tablet Take 1 tablet by mouth every 8 (eight) hours as needed.     Marland Kitchen levothyroxine (SYNTHROID, LEVOTHROID) 25 MCG tablet Take 25 mcg by mouth daily.    Marland Kitchen loperamide (IMODIUM A-D) 2 MG tablet Take 2 mg by mouth every 4 (four) hours as needed for diarrhea or loose stools.    . Melatonin 3 MG TABS Take 3 mg by mouth at bedtime.    . meloxicam (MOBIC) 7.5 MG tablet Take 7.5 mg by mouth daily.    . Multiple Vitamin (MULTIVITAMIN) capsule Take 1 capsule by mouth daily.    Marland Kitchen omeprazole (PRILOSEC) 20 MG capsule Take 20 mg by mouth daily.    . potassium chloride SA (K-DUR,KLOR-CON) 20 MEQ tablet Take 20 mEq by mouth 2 (two) times daily.    Marland Kitchen  sertraline (ZOLOFT) 25 MG tablet Take 25 mg by mouth daily.     No facility-administered medications prior to visit.     PAST MEDICAL HISTORY: Past Medical History:  Diagnosis Date  . Anxiety   . Arthritis   . Chronic back pain   . Depression   . GERD (gastroesophageal reflux disease)   . Hypercholesteremia   . Hypertension   . Hypothyroid   . Lumbar herniated disc   . Movement disorder    Parkinson disease    PAST SURGICAL HISTORY: Past Surgical History:  Procedure Laterality Date  . CHOLECYSTECTOMY    .  melanoma remove from head    . TONSILLECTOMY    . TOTAL KNEE ARTHROPLASTY     right    FAMILY HISTORY: No family history on file.  SOCIAL HISTORY: Social History   Socioeconomic History  . Marital status: Married    Spouse name: Not on file  . Number of children: Not on file  . Years of education: Not on file  . Highest education level: Not on file  Occupational History  . Not on file  Social Needs  . Financial resource strain: Not on file  . Food insecurity:    Worry: Not on file    Inability: Not on file  . Transportation needs:    Medical: Not on file    Non-medical: Not on file  Tobacco Use  . Smoking status: Never Smoker  . Smokeless tobacco: Never Used  Substance and Sexual Activity  . Alcohol use: Yes    Comment: occassionally  . Drug use: No  . Sexual activity: Not on file  Lifestyle  . Physical activity:    Days per week: Not on file    Minutes per session: Not on file  . Stress: Not on file  Relationships  . Social connections:    Talks on phone: Not on file    Gets together: Not on file    Attends religious service: Not on file    Active member of club or organization: Not on file    Attends meetings of clubs or organizations: Not on file    Relationship status: Not on file  . Intimate partner violence:    Fear of current or ex partner: Not on file    Emotionally abused: Not on file    Physically abused: Not on file    Forced sexual activity: Not on file  Other Topics Concern  . Not on file  Social History Narrative  . Not on file     PHYSICAL EXAM  There were no vitals filed for this visit. There is no height or weight on file to calculate BMI.  Generalized: Well developed, in no acute distress  Head: normocephalic and atraumatic,. Oropharynx benign  Neck: Supple, no carotid bruits  Cardiac: Regular rate rhythm, no murmur  Musculoskeletal: No deformity   Neurological examination   Mentation: Alert oriented to time, place, history  taking. Attention span and concentration appropriate. Recent and remote memory intact.  Follows all commands speech and language fluent.   Cranial nerve II-XII: Fundoscopic exam reveals sharp disc margins.Pupils were equal round reactive to light extraocular movements were full, visual field were full on confrontational test. Facial sensation and strength were normal. hearing was intact to finger rubbing bilaterally. Uvula tongue midline. head turning and shoulder shrug were normal and symmetric.Tongue protrusion into cheek strength was normal. Motor: normal bulk and tone, full strength in the BUE, BLE, fine finger movements normal,  no pronator drift. No focal weakness Sensory: normal and symmetric to light touch, pinprick, and  Vibration, proprioception  Coordination: finger-nose-finger, heel-to-shin bilaterally, no dysmetria Reflexes: Brachioradialis 2/2, biceps 2/2, triceps 2/2, patellar 2/2, Achilles 2/2, plantar responses were flexor bilaterally. Gait and Station: Rising up from seated position without assistance, normal stance,  moderate stride, good arm swing, smooth turning, able to perform tiptoe, and heel walking without difficulty. Tandem gait is steady  DIAGNOSTIC DATA (LABS, IMAGING, TESTING) - I reviewed patient records, labs, notes, testing and imaging myself where available.  Lab Results  Component Value Date   WBC 10.4 11/20/2007   HGB 8.8 (L) 11/20/2007   HCT 25.9 (L) 11/20/2007   MCV 89.6 11/20/2007   PLT 304 11/20/2007      Component Value Date/Time   NA 131 (L) 11/18/2007 0449   K 3.9 11/18/2007 0449   CL 95 (L) 11/18/2007 0449   CO2 25 11/18/2007 0449   GLUCOSE 154 (H) 11/18/2007 0449   BUN 16 11/18/2007 0449   CREATININE 0.90 11/18/2007 0449   CALCIUM 8.7 11/18/2007 0449   PROT 6.9 11/15/2007 0919   ALBUMIN 4.0 11/15/2007 0919   AST 29 11/15/2007 0919   ALT 25 11/15/2007 0919   ALKPHOS 90 11/15/2007 0919   BILITOT 0.8 11/15/2007 0919   GFRNONAA >60  11/18/2007 0449   GFRAA  11/18/2007 0449    >60        The eGFR has been calculated using the MDRD equation. This calculation has not been validated in all clinical   No results found for: CHOL, HDL, LDLCALC, LDLDIRECT, TRIG, CHOLHDL No results found for: HGBA1C No results found for: VITAMINB12 No results found for: TSH  ***  ASSESSMENT AND PLAN  84 y.o. year old female  has a past medical history of Anxiety, Arthritis, Chronic back pain, Depression, GERD (gastroesophageal reflux disease), Hypercholesteremia, Hypertension, Hypothyroid, Lumbar herniated disc, and Movement disorder. here with ***  Tinisha Lemppis a very pleasant 31 year oldfemalewith an underlying medical history of lumbar spine disease and chronic back pain, reflux disease, hypertension, arthritis, depression, anxiety, hypothyroidism, status post right total knee replacement, osteoporosis, prediabetes, diverticulosis, Malignant melanoma for which she is on treatment, whopresents for consultation of her parkinsonism. She has been on Sinemet, her diagnosis dates back to 28. She has been on Sinemet 25-100 milligrams strength 2 pills in the morning, one midday and 2 at night. Today I suggested we scale back on the Sinemet because of low blood pressure and recent diagnosis of melanoma. Some studies have suggested a link between Parkinson's disease and melanoma or taking Parkinson's medication and melanoma. It is not fully clear if Parkinson's itself increases the risk for melanoma or the medications that are used for Parkinson's disease may increase the risk for melanoma. Nevertheless, in her case I would like to reduce her Sinemet to 1 pill 3 times a day. She is agreeable. She is advised to be watchful of lightheadedness, be mindful of her water intake, she may also noticed motor repercussions as she reduces her Sinemet. She is advised to look for recurrence of tremor, difficulty with fine motor control, slowness and stiffness and  difficulty walking. She is advised to increase her water intake and limit her tea intake to 1-2 servings per day and instead try to drink 6 cups of water possible  Dennie Bible, [facility], [facility], APRN  [facility] [address] [place], [place] [address] [phone]

## 2017-12-29 ENCOUNTER — Ambulatory Visit: Payer: Medicare Other | Admitting: Nurse Practitioner

## 2017-12-30 ENCOUNTER — Encounter: Payer: Self-pay | Admitting: Nurse Practitioner

## 2018-01-17 ENCOUNTER — Ambulatory Visit: Payer: Medicare Other | Admitting: Nurse Practitioner

## 2018-04-23 NOTE — Progress Notes (Addendum)
[facility]  PATIENT: Kelly Erickson DOB: [birth year]   REASON FOR VISIT: follow up for PD HISTORY FROM:patient and caregiver    HISTORY OF PRESENT ILLNESS: Kelly Erickson is an 84 year old right-handed woman with an underlying medical history of lumbar spine disease and chronic back pain, melanoma, reflux disease, hypertension, arthritis, depression, anxiety, hypothyroidism, status post right total knee replacement, osteoporosis, prediabetes, and diverticulosis, who presents for follow-up consultation of her parkinsonism. The patient is accompanied by her son from [place], today. She missed an appointment on 06/27/17 due to acute illness and went to the ER. I first met her on 02/24/2017 at the request of her primary care physician, at which time she needed to transition care to another neurologist. She was stable on her dose of Sinemet 2 pills in the morning, 1 midday and 2 pills in the evening.   Today, 09/06/2017:SA She reports feeling about the same. Her blood pressure is low today. She admits that she has not had much to drink today. She likes to drink hot tea. She had one cup of tea today. Yesterday, she had 3 cups of tea, maybe 1 large cup of water altogether. She is not fully clear as to how much water she actually drinks on a day-to-day basis. She has not had any recent exacerbation of her motor symptoms, she was diagnosed with melanoma last year, she had a port placed for chemotherapy.  UPDATE 12/30/2019CM Kelly Erickson, 84 year old female returns for follow-up with a history of Parkinson's disease.  When last seen she was having a low blood pressure and her Sinemet dose was reduced by Dr.  Nordmann without increase in Parkinson symptoms.  She denies any falls since last seen.  She ambulates with a rolling walker she has a history of melanoma.  She has caregivers around the clock.  Appetite is good and she is sleeping well.  She reports that her memory is good.  She does complain with  diarrhea and is on Imodium.  She returns for reevaluation REVIEW OF SYSTEMS: Full 14 system review of systems performed and notable only for those listed, all others are neg:  Constitutional: neg  Cardiovascular: neg Ear/Nose/Throat: neg  Skin: neg Eyes: neg Respiratory: neg Gastroitestinal: neg  Hematology/Lymphatic: neg  Endocrine: neg Musculoskeletal: Walking difficulty Allergy/Immunology: neg Neurological: neg Psychiatric: neg Sleep : neg   ALLERGIES: Allergies  Allergen Reactions  . Codeine   . Percocet [Oxycodone-Acetaminophen]     HOME MEDICATIONS: Outpatient Medications Prior to Visit  Medication Sig Dispense Refill  . alendronate (FOSAMAX) 70 MG tablet Take 70 mg by mouth once a week. Take with a full glass of water on an empty stomach.    Marland Kitchen amLODipine (NORVASC) 5 MG tablet Take 5 mg by mouth daily.    Marland Kitchen atorvastatin (LIPITOR) 10 MG tablet Take 10 mg by mouth.    . carbidopa-levodopa (SINEMET IR) 25-100 MG tablet 2 in AM, 1 midday and 2 in the evening daily (Patient taking differently: 1 tablet 3 (three) times daily. 2 in AM, 1 midday and 2 in the evening daily) 450 tablet 3  . Cholecalciferol (VITAMIN D3) 2000 units TABS Take 2,000 Units by mouth daily.    . furosemide (LASIX) 20 MG tablet TAKE 1 TABLET (20 MG TOTAL) BY MOUTH DAILY.  3  . gabapentin (NEURONTIN) 100 MG capsule TAKE 1 CAPSULE (100 MG TOTAL) BY MOUTH DAILY.  3  . HYDROcodone-acetaminophen (NORCO) 7.5-325 MG tablet Take 1 tablet by mouth every 8 (eight) hours as needed.     Marland Kitchen  levothyroxine (SYNTHROID, LEVOTHROID) 25 MCG tablet Take 25 mcg by mouth daily.    Marland Kitchen loperamide (IMODIUM A-D) 2 MG tablet Take 2 mg by mouth every 4 (four) hours as needed for diarrhea or loose stools.    . Melatonin 3 MG TABS Take 3 mg by mouth at bedtime.    . meloxicam (MOBIC) 7.5 MG tablet Take 7.5 mg by mouth daily.    . Multiple Vitamin (MULTIVITAMIN) capsule Take 1 capsule by mouth daily.    Marland Kitchen omeprazole (PRILOSEC) 20 MG  capsule Take 20 mg by mouth daily.    . potassium chloride SA (K-DUR,KLOR-CON) 20 MEQ tablet Take 20 mEq by mouth 2 (two) times daily.    . sertraline (ZOLOFT) 25 MG tablet Take 25 mg by mouth daily.     No facility-administered medications prior to visit.     PAST MEDICAL HISTORY: Past Medical History:  Diagnosis Date  . Anxiety   . Arthritis   . Chronic back pain   . Depression   . GERD (gastroesophageal reflux disease)   . Hypercholesteremia   . Hypertension   . Hypothyroid   . Lumbar herniated disc   . Movement disorder    Parkinson disease    PAST SURGICAL HISTORY: Past Surgical History:  Procedure Laterality Date  . CHOLECYSTECTOMY    . melanoma remove from head    . TONSILLECTOMY    . TOTAL KNEE ARTHROPLASTY     right    FAMILY HISTORY: History reviewed. No pertinent family history.  SOCIAL HISTORY: Social History   Socioeconomic History  . Marital status: Married    Spouse name: Not on file  . Number of children: Not on file  . Years of education: Not on file  . Highest education level: Not on file  Occupational History  . Not on file  Social Needs  . Financial resource strain: Not on file  . Food insecurity:    Worry: Not on file    Inability: Not on file  . Transportation needs:    Medical: Not on file    Non-medical: Not on file  Tobacco Use  . Smoking status: Never Smoker  . Smokeless tobacco: Never Used  Substance and Sexual Activity  . Alcohol use: Yes    Comment: occassionally  . Drug use: No  . Sexual activity: Not on file  Lifestyle  . Physical activity:    Days per week: Not on file    Minutes per session: Not on file  . Stress: Not on file  Relationships  . Social connections:    Talks on phone: Not on file    Gets together: Not on file    Attends religious service: Not on file    Active member of club or organization: Not on file    Attends meetings of clubs or organizations: Not on file    Relationship status: Not on file   . Intimate partner violence:    Fear of current or ex partner: Not on file    Emotionally abused: Not on file    Physically abused: Not on file    Forced sexual activity: Not on file  Other Topics Concern  . Not on file  Social History Narrative  . Not on file     PHYSICAL EXAM  Vitals:   04/24/18 1505  BP: 115/71  Pulse: 96  Weight: 128 lb 9.6 oz (58.3 kg)  Height: 5' (1.524 m)   Body mass index is 25.12 kg/m.  Generalized: Well  developed, in no acute distress , well-groomed, mild masking of the face Head: normocephalic and atraumatic,. Oropharynx benign  Neck: Mildly rigid,  Musculoskeletal: No deformity  Skin bilateral puffy ankles Neurological examination   Mentation: Alert oriented to time, place, history taking. Attention span and concentration appropriate. Recent and remote memory intact.  Follows all commands speech hypophonia no dysarthria and language fluent.   Cranial nerve II-XII: Pupils were equal round reactive to light extraocular movements were full, visual field were full on confrontational test. Facial sensation and strength were normal. hearing was intact to finger rubbing bilaterally. Uvula tongue midline. head turning and shoulder shrug limited left worse than right were normal and symmetric.Tongue protrusion into cheek strength was normal. Motor: normal bulk and tone, full strength in the BUE, BLE, no resting tremor, fine motor skills mildly impaired. Sensory: normal and symmetric to light touch,  Gait and Station: Rising up from seated position with push off, stooped posture, balance mildly impaired uses a rolling walker, no difficulty with turns. DIAGNOSTIC DATA (LABS, IMAGING, TESTING) - I reviewed patient records, labs, notes, testing and imaging myself where available.  Lab Results  Component Value Date   WBC 10.4 11/20/2007   HGB 8.8 (L) 11/20/2007   HCT 25.9 (L) 11/20/2007   MCV 89.6 11/20/2007   PLT 304 11/20/2007      Component Value  Date/Time   NA 131 (L) 11/18/2007 0449   K 3.9 11/18/2007 0449   CL 95 (L) 11/18/2007 0449   CO2 25 11/18/2007 0449   GLUCOSE 154 (H) 11/18/2007 0449   BUN 16 11/18/2007 0449   CREATININE 0.90 11/18/2007 0449   CALCIUM 8.7 11/18/2007 0449   PROT 6.9 11/15/2007 0919   ALBUMIN 4.0 11/15/2007 0919   AST 29 11/15/2007 0919   ALT 25 11/15/2007 0919   ALKPHOS 90 11/15/2007 0919   BILITOT 0.8 11/15/2007 0919   GFRNONAA >60 11/18/2007 0449   GFRAA  11/18/2007 0449    >60        The eGFR has been calculated using the MDRD equation. This calculation has not been validated in all clinical   [place] a very pleasant 73 year oldfemalewith an underlying medical history of lumbar spine disease and chronic back pain, reflux disease, hypertension, arthritis, depression, anxiety, hypothyroidism, status post right total knee replacement, osteoporosis, prediabetes, diverticulosis, Malignant melanoma for which she is on treatment, whopresents for follow-up  of her parkinsonism. She has been on Sinemet, her diagnosis dates back to 89.  She is currently on Sinemet 25 100, 1 pill 3 times daily.  This dose was decreased at her last visit and she has had no increase in symptoms.  It was decreased due to low blood pressure and recent diagnosis of melanoma. Some studies have suggested a link between Parkinson's disease and melanoma or taking Parkinson's medication and melanoma. It is not fully clear if Parkinson's itself increases the risk for melanoma or the medications that are used for Parkinson's disease may increase the risk for melanoma. . She has not had recurrence of tremor or difficulty with fine motor control.     PLAN: Continue carbidopa-levodopa 25 100 1 tab three times daily will refill Rolling walker at all times for safe ambulation Stay well-hydrated Follow-up in 6 months Call for increase in symptoms Dennie Bible, [facility], [facility], [facility] [address], [place], [place] [address] [phone]  I reviewed the above note and documentation by the Nurse Practitioner  and agree with the history, physical exam, assessment and plan as outlined above. I was immediately available for face-to-face consultation. Star Age, MD, PhD [facility])

## 2018-04-24 ENCOUNTER — Ambulatory Visit (INDEPENDENT_AMBULATORY_CARE_PROVIDER_SITE_OTHER): Payer: Medicare Other | Admitting: Nurse Practitioner

## 2018-04-24 ENCOUNTER — Encounter: Payer: Self-pay | Admitting: Nurse Practitioner

## 2018-04-24 DIAGNOSIS — G20 Parkinson's disease: Secondary | ICD-10-CM | POA: Diagnosis not present

## 2018-04-24 MED ORDER — CARBIDOPA-LEVODOPA 25-100 MG PO TABS: 1.0000 | ORAL_TABLET | Freq: Three times a day (TID) | ORAL | 6 refills | Status: DC

## 2018-04-24 NOTE — Patient Instructions (Signed)
Continue carbidopa-levodopa 25 100 1 tab three times daily will refill Rolling walker at all times for safe ambulation Stay well-hydrated Follow-up in 6 months

## 2018-10-24 ENCOUNTER — Encounter: Payer: Self-pay | Admitting: Neurology

## 2018-10-24 ENCOUNTER — Other Ambulatory Visit: Payer: Self-pay

## 2018-10-24 ENCOUNTER — Ambulatory Visit (INDEPENDENT_AMBULATORY_CARE_PROVIDER_SITE_OTHER): Payer: Medicare Other | Admitting: Neurology

## 2018-10-24 VITALS — BP 112/72 | HR 93 | Temp 96.9°F | Ht 59.0 in | Wt 113.0 lb

## 2018-10-24 DIAGNOSIS — G20 Parkinson's disease: Secondary | ICD-10-CM

## 2018-10-24 NOTE — Progress Notes (Signed)
Subjective:    Patient ID: Kelly Erickson is a 85 y.o. female.  HPI     Interim history:   Kelly Erickson is an 85 year old right-handed woman with an underlying medical history of lumbar spine disease and chronic back pain, melanoma, reflux disease, hypertension, arthritis, depression, anxiety, hypothyroidism, status post right total knee replacement, osteoporosis, prediabetes, and diverticulosis, who presents for follow-up consultation of her parkinsonism. The patient is accompanied by her daughter today. I last saw her on 09/06/2017 at which time I suggested we reduce her Sinemet to 1 pill 3 times daily.  She had a low blood pressure and was not hydrating very well.  She saw Cecille Rubin, nurse practitioner in the interim on 04/24/2018, at which time she was advised to continue with Sinemet 1 pill 3 times daily.  Today, 10/24/2018: She reports Feeling fairly stable, thankfully no falls, she does have some constipation from time to time.  She tries to hydrate well with water.  She drinks 2 Ensure bottles per day.  She likes to drink hot tea with breakfast and lunch.  She has not been sleeping all that well for the past month or so.  She does believe melatonin helps to some degree.  She takes eyedrops for dry eyes, had a recent eye examination.  She continues to take Sinemet 3 times a day and feels fairly stable with it.   The patient's allergies, current medications, family history, past medical history, past social history, past surgical history and problem list were reviewed and updated as appropriate.    Previously:  She missed an appointment on 06/27/17 due to acute illness and went to the ER. I first met her on 02/24/2017 at the request of her primary care physician, at which time she needed to transition care to another neurologist. She was stable on her dose of Sinemet 2 pills in the morning, 1 midday and 2 pills in the evening.    02/24/2017: (She) has previously been followed at [facility]  neurology for parkinsonism. She has been on Sinemet for some time, probably since 2015. She was last seen at [place] [place] on 07/16/2016. She has a longer standing history of gait disorder going back 3-4 years. She has fallen in the past. She has been using a walker. She has had some issues with constipation. She has been on Sinemet for at least 3 years. She was tried in the interim on [place], but had side effects. Currently, she is on Sinemet 25-100 milligrams strength 2 pills in the morning, 1 at lunchtime, 2 pills in the evenings. She lives at home with her husband who is also 73 years old and has Alzheimer's disease. They have a caretaker at the house at all times. She has a total of 5 children, 2 sons in [place], daughter in [place], 1 son in [place] and one son in [place]. She is a nonsmoker. She does not drink caffeine with the exception of hot tea in the morning. She does not typically drink coffee or soda. She does not always drink a lot of water. She estimates that she drinks about 24 ounces of water per day. She has not had any recent falls thankfully. She uses a rolling walker inside the house. She does not exercise on her day-to-day basis. She wakes up often with leg cramps, particularly on the left side. She had no significant one-sided issues in the beginning. She recalls that she had gait problems starting in 2015, she had injured her  left shoulder after a fall. This required no surgery, she had therapy for this. She does not have much in the way of tremor. I was able to review records from [facility] [place]. She sees pain management for her back pain. She had back surgery. She had right total knee replacement surgery.  Her Past Medical History Is Significant For: Past Medical History:  Diagnosis Date  . Anxiety   . Arthritis   . Chronic back pain   . Depression   . GERD (gastroesophageal reflux disease)   . Hypercholesteremia   . Hypertension   .  Hypothyroid   . Lumbar herniated disc   . Movement disorder    Parkinson disease    Her Past Surgical History Is Significant For: Past Surgical History:  Procedure Laterality Date  . CHOLECYSTECTOMY    . melanoma remove from head    . TONSILLECTOMY    . TOTAL KNEE ARTHROPLASTY     right    Her Family History Is Significant For: No family history on file.  Her Social History Is Significant For: Social History   Socioeconomic History  . Marital status: Married    Spouse name: Not on file  . Number of children: Not on file  . Years of education: Not on file  . Highest education level: Not on file  Occupational History  . Not on file  Social Needs  . Financial resource strain: Not on file  . Food insecurity    Worry: Not on file    Inability: Not on file  . Transportation needs    Medical: Not on file    Non-medical: Not on file  Tobacco Use  . Smoking status: Never Smoker  . Smokeless tobacco: Never Used  Substance and Sexual Activity  . Alcohol use: Yes    Comment: occassionally  . Drug use: No  . Sexual activity: Not on file  Lifestyle  . Physical activity    Days per week: Not on file    Minutes per session: Not on file  . Stress: Not on file  Relationships  . Social Herbalist on phone: Not on file    Gets together: Not on file    Attends religious service: Not on file    Active member of club or organization: Not on file    Attends meetings of clubs or organizations: Not on file    Relationship status: Not on file  Other Topics Concern  . Not on file  Social History Narrative  . Not on file    Her Allergies Are:  Allergies  Allergen Reactions  . Codeine   . Percocet [Oxycodone-Acetaminophen]   :   Her Current Medications Are:  Outpatient Encounter Medications as of 10/24/2018  Medication Sig  . alendronate (FOSAMAX) 70 MG tablet Take 70 mg by mouth once a week. Take with a full glass of water on an empty stomach.  Marland Kitchen amLODipine  (NORVASC) 5 MG tablet Take 5 mg by mouth daily.  Marland Kitchen atorvastatin (LIPITOR) 10 MG tablet Take 10 mg by mouth.  . carbidopa-levodopa (SINEMET IR) 25-100 MG tablet Take 1 tablet by mouth 3 (three) times daily. 1 tablet 3 times daily  . Cholecalciferol (VITAMIN D3) 2000 units TABS Take 2,000 Units by mouth daily.  . furosemide (LASIX) 20 MG tablet TAKE 1 TABLET (20 MG TOTAL) BY MOUTH DAILY.  Marland Kitchen gabapentin (NEURONTIN) 100 MG capsule TAKE 1 CAPSULE (100 MG TOTAL) BY MOUTH DAILY.  Marland Kitchen  HYDROcodone-acetaminophen (NORCO) 7.5-325 MG tablet Take 1 tablet by mouth every 8 (eight) hours as needed.   Marland Kitchen levothyroxine (SYNTHROID, LEVOTHROID) 25 MCG tablet Take 25 mcg by mouth daily.  Marland Kitchen loperamide (IMODIUM A-D) 2 MG tablet Take 2 mg by mouth every 4 (four) hours as needed for diarrhea or loose stools.  . Melatonin 3 MG TABS Take 3 mg by mouth at bedtime.  . meloxicam (MOBIC) 7.5 MG tablet Take 7.5 mg by mouth daily.  . Multiple Vitamin (MULTIVITAMIN) capsule Take 1 capsule by mouth daily.  Marland Kitchen omeprazole (PRILOSEC) 20 MG capsule Take 20 mg by mouth daily.  . potassium chloride SA (K-DUR,KLOR-CON) 20 MEQ tablet Take 20 mEq by mouth 2 (two) times daily.  . sertraline (ZOLOFT) 25 MG tablet Take 25 mg by mouth daily.   No facility-administered encounter medications on file as of 10/24/2018.   :  Review of Systems:  Out of a complete 14 point review of systems, all are reviewed and negative with the exception of these symptoms as listed below: Review of Systems  Neurological:       Pt presents today to discuss her PD.    Objective:  Neurological Exam  Physical Exam Physical Examination:   Vitals:   10/24/18 1432  BP: 112/72  Pulse: 93  Temp: (!) 96.9 F (36.1 C)    General Examination: The patient is a very pleasant 85 y.o. female in no acute distress. She appears well-developed and well-nourished and well groomed.   HEENT:Normocephalic, atraumatic, pupils are equal, round and reactive to light and  accommodation. Eyes look mildly irritated, left more than right, mildly dry eyes bilaterally. Extraocular tracking is mildly impaired, face is symmetric, mild facial masking as noted, hearing is grossly intact. Neck is mildly rigid, she has no lip, neck or jaw tremor. Airway examination reveals mild to moderate mouth dryness, tongue protrudes centrally and palate elevates symmetrically. She has mild hypophonia, no dysarthria.  Chest:Clear to auscultation without wheezing, rhonchi or crackles noted.   Heart:S1+S2+0, regular and normal without murmurs, rubs or gallops noted.   Abdomen:Soft, non-tender and non-distended with normal bowel sounds appreciated on auscultation.  Extremities:There isnopitting edema in the distal lower extremities bilaterally. She wears Compression stockings up to the knees bilaterally.  Skin: Warm and dry without trophic changes.  Musculoskeletal: exam revealsdecreased range of motion in both shoulders, left worse than right. She has decreased range of motion in the right knee, fairly stable.Arthritic changes noted in both hands.  Neurologically:  Mental status: The patient is awake, alert and oriented in all 4 spheres.Herimmediate and remote memory, attention, language skills and fund of knowledge are fairly appropriate. There is no evidence of aphasia, agnosia, apraxia or anomia. Mood is normaland affect is normal.  Cranial nerves II - XII are as described above under HEENT exam. In addition: shoulder shrug islimited bilaterally, left worse than right.  Motor exam: Normal bulk,global strength of 4+ out of 5, no resting tremor. She has no action or postural tremor. Romberg is not testable safely. Fine motor skills are globally mildly impaired, with some lateralization to the left.  Gait, station and balance, she stands up with mild difficulty, posture is stooped, she uses a rolling walker, Maneuvers the walker well. Balance is mildly impaired.    Assessmentand Plan:  In summary,Kimika Lemppis a very pleasant 54 year oldfemalewith an underlying medical history of lumbar spine disease and chronic back pain, reflux disease, hypertension, arthritis, depression, anxiety, hypothyroidism, status post right total knee replacement, osteoporosis, prediabetes,  diverticulosis, Malignant melanoma for which she is on treatment, whopresents for FU consultation of her parkinsonism. She has been on Sinemet, and her diagnosis dates back to 67. She is Currently on Sinemet 25-100 milligrams strength 1 pill tid. We scaled back on the Sinemet because about 1 year ago, due to low blood pressure.She is advised to continue with the medication, her prescription is up-to-date.  We talked about the importance of healthy lifestyle, good nutrition, good hydration with water and constipation management. She is advised to use her walker at all times.  We talked about the importance of fall prevention.  She is advised to follow-up in 6 months to see 1 of our nurse practitioners.  I answered all their questions today and the patient and her daughter were in agreement.  I spent 25 minutes in total face-to-face time with the patient, more than 50% of which was spent in counseling and coordination of care, reviewing test results, reviewing medication and discussing or reviewing the diagnosis of PD, its prognosis and treatment options. Pertinent laboratory and imaging test results that were available during this visit with the patient were reviewed by me and considered in my medical decision making (see chart for details).

## 2018-10-24 NOTE — Patient Instructions (Signed)
We will keep your Sinemet the same.  Please be really proactive about constipation issues, try to drink plenty of water and having a Diet rich in fresh fruit and vegetables is going to help and physical activity also helps.  Try to use a stool softener and even a laxative such as Dulcolax or MiraLAX as needed.  Please try to have a formed stool at least every other day.

## 2019-05-09 ENCOUNTER — Telehealth: Payer: Self-pay

## 2019-05-09 ENCOUNTER — Ambulatory Visit: Payer: Medicare Other | Admitting: Adult Health

## 2019-05-09 ENCOUNTER — Encounter: Payer: Self-pay | Admitting: Adult Health

## 2019-05-09 NOTE — Telephone Encounter (Signed)
Patient was a no call/no show for their appointment today.   

## 2019-05-15 ENCOUNTER — Other Ambulatory Visit: Payer: Self-pay | Admitting: Neurology

## 2019-05-15 MED ORDER — CARBIDOPA-LEVODOPA 25-100 MG PO TABS: 1.0000 | ORAL_TABLET | Freq: Three times a day (TID) | ORAL | 1 refills | Status: AC

## 2023-06-25 DEATH — deceased
